# Patient Record
Sex: Female | Born: 1975 | Race: White | Hispanic: No | Marital: Married | Smoking: Never smoker
Health system: Southern US, Community
[De-identification: ages and names within clinical notes are randomized; demographics above are authoritative.]

## PROBLEM LIST (undated history)

## (undated) DIAGNOSIS — R87619 Unspecified abnormal cytological findings in specimens from cervix uteri: Secondary | ICD-10-CM

## (undated) DIAGNOSIS — K589 Irritable bowel syndrome without diarrhea: Secondary | ICD-10-CM

## (undated) DIAGNOSIS — Z8619 Personal history of other infectious and parasitic diseases: Secondary | ICD-10-CM

## (undated) DIAGNOSIS — IMO0002 Reserved for concepts with insufficient information to code with codable children: Secondary | ICD-10-CM

## (undated) DIAGNOSIS — O09529 Supervision of elderly multigravida, unspecified trimester: Secondary | ICD-10-CM

## (undated) HISTORY — DX: Unspecified abnormal cytological findings in specimens from cervix uteri: R87.619

## (undated) HISTORY — DX: Supervision of elderly multigravida, unspecified trimester: O09.529

## (undated) HISTORY — PX: WISDOM TOOTH EXTRACTION: SHX21

## (undated) HISTORY — DX: Reserved for concepts with insufficient information to code with codable children: IMO0002

## (undated) HISTORY — DX: Irritable bowel syndrome, unspecified: K58.9

## (undated) HISTORY — DX: Personal history of other infectious and parasitic diseases: Z86.19

---

## 2010-09-22 ENCOUNTER — Inpatient Hospital Stay (HOSPITAL_COMMUNITY): Admission: AD | Admit: 2010-09-22 | Payer: Self-pay | Source: Ambulatory Visit | Admitting: Obstetrics and Gynecology

## 2010-09-23 ENCOUNTER — Inpatient Hospital Stay (HOSPITAL_COMMUNITY): Admission: AD | Admit: 2010-09-23 | Payer: Self-pay | Source: Ambulatory Visit | Admitting: Obstetrics and Gynecology

## 2011-03-13 LAB — HIV ANTIBODY (ROUTINE TESTING W REFLEX): HIV: NONREACTIVE

## 2011-03-13 LAB — RUBELLA ANTIBODY, IGM: Rubella: IMMUNE

## 2011-03-13 LAB — HEPATITIS B SURFACE ANTIGEN: Hepatitis B Surface Ag: NEGATIVE

## 2011-03-13 LAB — ANTIBODY SCREEN: Antibody Screen: NEGATIVE

## 2011-03-13 LAB — RPR: RPR: NONREACTIVE

## 2011-10-11 ENCOUNTER — Inpatient Hospital Stay (HOSPITAL_COMMUNITY): Admission: AD | Admit: 2011-10-11 | Payer: Self-pay | Source: Home / Self Care | Admitting: Obstetrics and Gynecology

## 2011-10-12 ENCOUNTER — Telehealth (HOSPITAL_COMMUNITY): Payer: Self-pay | Admitting: *Deleted

## 2011-10-12 ENCOUNTER — Encounter (HOSPITAL_COMMUNITY): Payer: Self-pay | Admitting: *Deleted

## 2011-10-12 NOTE — Telephone Encounter (Signed)
Preadmission screen  

## 2011-10-20 ENCOUNTER — Encounter (HOSPITAL_COMMUNITY): Payer: Self-pay

## 2011-10-20 ENCOUNTER — Inpatient Hospital Stay (HOSPITAL_COMMUNITY)
Admission: RE | Admit: 2011-10-20 | Discharge: 2011-10-25 | DRG: 371 | Disposition: A | Discharge status: Home Health | Payer: BC Managed Care – PPO | Source: Ambulatory Visit | Attending: Obstetrics and Gynecology | Admitting: Obstetrics and Gynecology

## 2011-10-20 DIAGNOSIS — O480 Post-term pregnancy: Principal | ICD-10-CM | POA: Diagnosis present

## 2011-10-20 DIAGNOSIS — O09519 Supervision of elderly primigravida, unspecified trimester: Secondary | ICD-10-CM | POA: Diagnosis present

## 2011-10-20 DIAGNOSIS — O324XX0 Maternal care for high head at term, not applicable or unspecified: Secondary | ICD-10-CM | POA: Diagnosis present

## 2011-10-20 LAB — CBC
HCT: 38.3 % (ref 36.0–46.0)
Hemoglobin: 12.7 g/dL (ref 12.0–15.0)
RBC: 4.38 MIL/uL (ref 3.87–5.11)
WBC: 11.6 10*3/uL — ABNORMAL HIGH (ref 4.0–10.5)

## 2011-10-20 MED ORDER — LACTATED RINGERS IV SOLN
INTRAVENOUS | Status: DC
  Administered 2011-10-20 – 2011-10-21 (×3): via INTRAVENOUS
  Administered 2011-10-22: 300 mL via INTRAVENOUS

## 2011-10-20 MED ORDER — LACTATED RINGERS IV SOLN: 500.0000 mL | INTRAVENOUS | Status: DC | PRN

## 2011-10-20 MED ORDER — LIDOCAINE HCL (PF) 1 % IJ SOLN: 30.0000 mL | INTRAMUSCULAR | Status: DC | PRN

## 2011-10-20 MED ORDER — ACETAMINOPHEN 325 MG PO TABS: 650.0000 mg | ORAL_TABLET | ORAL | Status: DC | PRN

## 2011-10-20 MED ORDER — MISOPROSTOL 25 MCG QUARTER TABLET
25.0000 ug | ORAL_TABLET | ORAL | Status: DC | PRN
  Administered 2011-10-20 – 2011-10-21 (×2): 25 ug via VAGINAL
  Filled 2011-10-20 (×3): qty 0.25

## 2011-10-20 MED ORDER — OXYCODONE-ACETAMINOPHEN 5-325 MG PO TABS: 1.0000 | ORAL_TABLET | ORAL | Status: DC | PRN

## 2011-10-20 MED ORDER — FLEET ENEMA 7-19 GM/118ML RE ENEM: 1.0000 | ENEMA | RECTAL | Status: DC | PRN

## 2011-10-20 MED ORDER — ZOLPIDEM TARTRATE 10 MG PO TABS
10.0000 mg | ORAL_TABLET | Freq: Every evening | ORAL | Status: DC | PRN
  Administered 2011-10-20: 10 mg via ORAL
  Filled 2011-10-20: qty 1

## 2011-10-20 MED ORDER — OXYTOCIN BOLUS FROM INFUSION
500.0000 mL | Freq: Once | INTRAVENOUS | Status: DC
  Filled 2011-10-20: qty 500
  Filled 2011-10-20: qty 1000

## 2011-10-20 MED ORDER — IBUPROFEN 600 MG PO TABS: 600.0000 mg | ORAL_TABLET | Freq: Four times a day (QID) | ORAL | Status: DC | PRN

## 2011-10-20 MED ORDER — ONDANSETRON HCL 4 MG/2ML IJ SOLN
4.0000 mg | Freq: Four times a day (QID) | INTRAMUSCULAR | Status: DC | PRN
  Administered 2011-10-21: 4 mg via INTRAVENOUS
  Filled 2011-10-20: qty 2

## 2011-10-20 MED ORDER — CITRIC ACID-SODIUM CITRATE 334-500 MG/5ML PO SOLN
30.0000 mL | ORAL | Status: DC | PRN
  Administered 2011-10-22: 30 mL via ORAL
  Filled 2011-10-20: qty 15

## 2011-10-20 MED ORDER — TERBUTALINE SULFATE 1 MG/ML IJ SOLN: 0.2500 mg | Freq: Once | INTRAMUSCULAR | Status: AC | PRN

## 2011-10-20 MED ORDER — OXYTOCIN 20 UNITS IN LACTATED RINGERS INFUSION - SIMPLE: 125.0000 mL/h | Freq: Once | INTRAVENOUS | Status: DC

## 2011-10-20 NOTE — Progress Notes (Signed)
Called pharmacy to inform that [facility] is out of cytotec. Was told that someone from pharmacy was on their way to restock.

## 2011-10-21 ENCOUNTER — Encounter (HOSPITAL_COMMUNITY): Payer: Self-pay

## 2011-10-21 LAB — RPR: RPR Ser Ql: NONREACTIVE

## 2011-10-21 MED ORDER — OXYTOCIN 20 UNITS IN LACTATED RINGERS INFUSION - SIMPLE
1.0000 m[IU]/min | INTRAVENOUS | Status: DC
  Administered 2011-10-21: 2 m[IU]/min via INTRAVENOUS
  Administered 2011-10-21: 16 m[IU]/min via INTRAVENOUS

## 2011-10-21 MED ORDER — DIPHENHYDRAMINE HCL 50 MG/ML IJ SOLN: 12.5000 mg | INTRAMUSCULAR | Status: DC | PRN

## 2011-10-21 MED ORDER — EPHEDRINE 5 MG/ML INJ
10.0000 mg | INTRAVENOUS | Status: DC | PRN
  Administered 2011-10-21: 10 mg via INTRAVENOUS

## 2011-10-21 MED ORDER — FENTANYL 2.5 MCG/ML BUPIVACAINE 1/10 % EPIDURAL INFUSION (WH - ANES)
14.0000 mL/h | INTRAMUSCULAR | Status: DC
  Administered 2011-10-21 – 2011-10-22 (×4): 14 mL/h via EPIDURAL
  Filled 2011-10-21 (×4): qty 60

## 2011-10-21 MED ORDER — PHENYLEPHRINE 40 MCG/ML (10ML) SYRINGE FOR IV PUSH (FOR BLOOD PRESSURE SUPPORT): 80.0000 ug | PREFILLED_SYRINGE | INTRAVENOUS | Status: DC | PRN

## 2011-10-21 MED ORDER — TERBUTALINE SULFATE 1 MG/ML IJ SOLN: 0.2500 mg | Freq: Once | INTRAMUSCULAR | Status: AC | PRN

## 2011-10-21 MED ORDER — LIDOCAINE HCL (PF) 1 % IJ SOLN
INTRAMUSCULAR | Status: DC | PRN
  Administered 2011-10-21 (×3): 4 mL

## 2011-10-21 MED ORDER — LACTATED RINGERS IV SOLN: 500.0000 mL | Freq: Once | INTRAVENOUS | Status: DC

## 2011-10-21 MED ORDER — ZOLPIDEM TARTRATE 10 MG PO TABS: 10.0000 mg | ORAL_TABLET | Freq: Every evening | ORAL | Status: DC | PRN

## 2011-10-21 MED ORDER — EPHEDRINE 5 MG/ML INJ: 10.0000 mg | INTRAVENOUS | Status: DC | PRN

## 2011-10-21 NOTE — Anesthesia Preprocedure Evaluation (Signed)
Anesthesia Evaluation  Patient identified by MRN, date of birth, ID band Patient awake    Reviewed: Allergy & Precautions, H&P , NPO status , Patient's Chart, lab work & pertinent test results, reviewed documented beta blocker date and time   History of Anesthesia Complications Negative for: history of anesthetic complications  Airway Mallampati: I TM Distance: >3 FB Neck ROM: full    Dental  (+) Teeth Intact   Pulmonary neg pulmonary ROS,  clear to auscultation        Cardiovascular neg cardio ROS regular Normal    Neuro/Psych Negative Neurological ROS  Negative Psych ROS   GI/Hepatic negative GI ROS, Neg liver ROS,   Endo/Other  Negative Endocrine ROS  Renal/GU negative Renal ROS  Genitourinary negative   Musculoskeletal   Abdominal   Peds  Hematology negative hematology ROS (+)   Anesthesia Other Findings   Reproductive/Obstetrics (+) Pregnancy                           Anesthesia Physical Anesthesia Plan  ASA: II  Anesthesia Plan: Epidural   Post-op Pain Management:    Induction:   Airway Management Planned:   Additional Equipment:   Intra-op Plan:   Post-operative Plan:   Informed Consent: I have reviewed the patients History and Physical, chart, labs and discussed the procedure including the risks, benefits and alternatives for the proposed anesthesia with the patient or authorized representative who has indicated his/her understanding and acceptance.     Plan Discussed with:   Anesthesia Plan Comments:         Anesthesia Quick Evaluation  

## 2011-10-21 NOTE — H&P (Signed)
Erica Green is a 36 y.o. female presenting for induction of labor due to postdates.  Preg complicated by AMA with normal 1st tri screen GBS -. Maternal Medical History:  Contractions: Onset was 1-2 hours ago.      OB History    Grav Para Term Preterm Abortions TAB SAB Ect Mult Living   1 0 0 0 0 0 0 0 0 0      Past Medical History  Diagnosis Date  . Abnormal Pap smear     mild dysplasia  . History of chicken pox   . AMA (advanced maternal age) multigravida 35+   . IBS (irritable bowel syndrome)    Past Surgical History  Procedure Date  . Wisdom tooth extraction    Family History: family history includes Cancer in her maternal grandmother; Diabetes in her paternal grandfather; and Hypertension in her maternal aunt, maternal grandmother, and mother. Social History:  reports that she has never smoked. She has never used smokeless tobacco. She reports that she does not drink alcohol or use illicit drugs.  ROS  Dilation: 1 Effacement (%): 90 Station: -2 Exam by:: Rolena Knutson Blood pressure 110/68, pulse 80, temperature 98.5 F (36.9 C), temperature source Oral, resp. rate 20, height 5' 4.5" (1.638 m), weight 77.111 kg (170 lb), last menstrual period 01/06/2011. Exam Physical Exam  Prenatal labs: ABO, Rh: A/Positive/-- (07/20 0000) Antibody: Negative (07/20 0000) Rubella: Immune (07/20 0000) RPR: Nonreactive (07/20 0000)  HBsAg: Negative (07/20 0000)  HIV: Non-reactive (07/20 0000)  GBS: Negative (01/21 0000)   Assessment/Plan: IUP postdates AROM and pitocin  Anticipate SVD   Jenniah Bhavsar C 10/21/2011, 8:31 AM

## 2011-10-21 NOTE — Anesthesia Procedure Notes (Signed)
Epidural Patient location during procedure: OB Start time: 10/21/2011 2:05 PM Reason for block: procedure for pain  Staffing Performed by: anesthesiologist   Preanesthetic Checklist Completed: patient identified, site marked, surgical consent, pre-op evaluation, timeout performed, IV checked, risks and benefits discussed and monitors and equipment checked  Epidural Patient position: sitting Prep: site prepped and draped and DuraPrep Patient monitoring: continuous pulse ox and blood pressure Approach: midline Injection technique: LOR air  Needle:  Needle type: Tuohy  Needle gauge: 17 G Needle length: 9 cm Catheter type: closed end flexible Catheter size: 19 Gauge Test dose: negative  Assessment Events: blood not aspirated, injection not painful, no injection resistance, negative IV test and paresthesia  Additional Notes Discussed risk of headache, infection, bleeding, nerve injury and failed or incomplete block.  Patient voices understanding and wishes to proceed.  Left sided paresthesia with initial placement of catheter, unrelieved by redirecting the bevel.  Paresthesia resolved completely and immediately with removal of the catheter.  Removed and replaced at same level without incident.  Jasmine December, MD

## 2011-10-22 ENCOUNTER — Inpatient Hospital Stay (HOSPITAL_COMMUNITY): Payer: BC Managed Care – PPO | Admitting: Anesthesiology

## 2011-10-22 ENCOUNTER — Encounter (HOSPITAL_COMMUNITY): Payer: Self-pay | Admitting: Anesthesiology

## 2011-10-22 ENCOUNTER — Encounter (HOSPITAL_COMMUNITY): Payer: Self-pay

## 2011-10-22 ENCOUNTER — Encounter (HOSPITAL_COMMUNITY)
Admission: RE | Disposition: A | Discharge status: Home Health | Payer: Self-pay | Source: Ambulatory Visit | Attending: Obstetrics and Gynecology

## 2011-10-22 SURGERY — Surgical Case
Anesthesia: Regional | Site: Abdomen | Wound class: Clean Contaminated

## 2011-10-22 MED ORDER — MORPHINE SULFATE (PF) 0.5 MG/ML IJ SOLN
INTRAMUSCULAR | Status: DC | PRN
  Administered 2011-10-22: 4 mg via EPIDURAL

## 2011-10-22 MED ORDER — MORPHINE SULFATE (PF) 0.5 MG/ML IJ SOLN
INTRAMUSCULAR | Status: DC | PRN
  Administered 2011-10-22: 1 mg via INTRAVENOUS

## 2011-10-22 MED ORDER — PROMETHAZINE HCL 25 MG/ML IJ SOLN: 6.2500 mg | INTRAMUSCULAR | Status: DC | PRN

## 2011-10-22 MED ORDER — KETOROLAC TROMETHAMINE 30 MG/ML IJ SOLN: 30.0000 mg | Freq: Four times a day (QID) | INTRAMUSCULAR | Status: AC | PRN

## 2011-10-22 MED ORDER — LACTATED RINGERS IR SOLN
Status: DC | PRN
  Administered 2011-10-22: 1000 mL

## 2011-10-22 MED ORDER — DIBUCAINE 1 % RE OINT: 1.0000 "application " | TOPICAL_OINTMENT | RECTAL | Status: DC | PRN

## 2011-10-22 MED ORDER — DIPHENHYDRAMINE HCL 50 MG/ML IJ SOLN: 12.5000 mg | INTRAMUSCULAR | Status: DC | PRN

## 2011-10-22 MED ORDER — MENTHOL 3 MG MT LOZG: 1.0000 | LOZENGE | OROMUCOSAL | Status: DC | PRN

## 2011-10-22 MED ORDER — ONDANSETRON HCL 4 MG/2ML IJ SOLN: 4.0000 mg | INTRAMUSCULAR | Status: DC | PRN

## 2011-10-22 MED ORDER — LANOLIN HYDROUS EX OINT: 1.0000 "application " | TOPICAL_OINTMENT | CUTANEOUS | Status: DC | PRN

## 2011-10-22 MED ORDER — SIMETHICONE 80 MG PO CHEW
80.0000 mg | CHEWABLE_TABLET | Freq: Three times a day (TID) | ORAL | Status: DC
  Administered 2011-10-22 – 2011-10-25 (×9): 80 mg via ORAL

## 2011-10-22 MED ORDER — TETANUS-DIPHTH-ACELL PERTUSSIS 5-2.5-18.5 LF-MCG/0.5 IM SUSP: 0.5000 mL | Freq: Once | INTRAMUSCULAR | Status: DC

## 2011-10-22 MED ORDER — LIDOCAINE-EPINEPHRINE (PF) 2 %-1:200000 IJ SOLN
INTRAMUSCULAR | Status: AC
  Filled 2011-10-22: qty 20

## 2011-10-22 MED ORDER — KETOROLAC TROMETHAMINE 60 MG/2ML IM SOLN
INTRAMUSCULAR | Status: AC
  Filled 2011-10-22: qty 2

## 2011-10-22 MED ORDER — MEPERIDINE HCL 25 MG/ML IJ SOLN: 6.2500 mg | INTRAMUSCULAR | Status: DC | PRN

## 2011-10-22 MED ORDER — OXYTOCIN 20 UNITS IN LACTATED RINGERS INFUSION - SIMPLE
INTRAVENOUS | Status: DC | PRN
  Administered 2011-10-22: 20 [IU] via INTRAVENOUS

## 2011-10-22 MED ORDER — PRENATAL MULTIVITAMIN CH
1.0000 | ORAL_TABLET | Freq: Every day | ORAL | Status: DC
  Administered 2011-10-23 – 2011-10-25 (×3): 1 via ORAL
  Filled 2011-10-22 (×4): qty 1

## 2011-10-22 MED ORDER — OXYTOCIN 10 UNIT/ML IJ SOLN
INTRAMUSCULAR | Status: AC
  Filled 2011-10-22: qty 2

## 2011-10-22 MED ORDER — SCOPOLAMINE 1 MG/3DAYS TD PT72
1.0000 | MEDICATED_PATCH | Freq: Once | TRANSDERMAL | Status: AC
  Administered 2011-10-22: 1.5 mg via TRANSDERMAL

## 2011-10-22 MED ORDER — ONDANSETRON HCL 4 MG/2ML IJ SOLN
INTRAMUSCULAR | Status: DC | PRN
  Administered 2011-10-22: 4 mg via INTRAVENOUS

## 2011-10-22 MED ORDER — SIMETHICONE 80 MG PO CHEW: 80.0000 mg | CHEWABLE_TABLET | ORAL | Status: DC | PRN

## 2011-10-22 MED ORDER — KETOROLAC TROMETHAMINE 30 MG/ML IJ SOLN: 15.0000 mg | Freq: Once | INTRAMUSCULAR | Status: DC | PRN

## 2011-10-22 MED ORDER — OXYCODONE-ACETAMINOPHEN 5-325 MG PO TABS
1.0000 | ORAL_TABLET | ORAL | Status: DC | PRN
  Administered 2011-10-22 – 2011-10-25 (×8): 1 via ORAL
  Filled 2011-10-22 (×8): qty 1

## 2011-10-22 MED ORDER — NALBUPHINE HCL 10 MG/ML IJ SOLN
5.0000 mg | INTRAMUSCULAR | Status: DC | PRN
  Filled 2011-10-22: qty 1

## 2011-10-22 MED ORDER — WITCH HAZEL-GLYCERIN EX PADS: 1.0000 "application " | MEDICATED_PAD | CUTANEOUS | Status: DC | PRN

## 2011-10-22 MED ORDER — SCOPOLAMINE 1 MG/3DAYS TD PT72
MEDICATED_PATCH | TRANSDERMAL | Status: AC
  Filled 2011-10-22: qty 1

## 2011-10-22 MED ORDER — OXYTOCIN 20 UNITS IN LACTATED RINGERS INFUSION - SIMPLE
125.0000 mL/h | INTRAVENOUS | Status: AC
  Administered 2011-10-22: 125 mL/h via INTRAVENOUS
  Filled 2011-10-22: qty 1000

## 2011-10-22 MED ORDER — DIPHENHYDRAMINE HCL 25 MG PO CAPS: 25.0000 mg | ORAL_CAPSULE | Freq: Four times a day (QID) | ORAL | Status: DC | PRN

## 2011-10-22 MED ORDER — IBUPROFEN 600 MG PO TABS
600.0000 mg | ORAL_TABLET | Freq: Four times a day (QID) | ORAL | Status: DC
  Administered 2011-10-22 – 2011-10-25 (×12): 600 mg via ORAL
  Filled 2011-10-22 (×7): qty 1

## 2011-10-22 MED ORDER — ONDANSETRON HCL 4 MG/2ML IJ SOLN
INTRAMUSCULAR | Status: AC
  Filled 2011-10-22: qty 2

## 2011-10-22 MED ORDER — ONDANSETRON HCL 4 MG/2ML IJ SOLN: 4.0000 mg | Freq: Three times a day (TID) | INTRAMUSCULAR | Status: DC | PRN

## 2011-10-22 MED ORDER — KETOROLAC TROMETHAMINE 60 MG/2ML IM SOLN
60.0000 mg | Freq: Once | INTRAMUSCULAR | Status: AC | PRN
  Administered 2011-10-22: 60 mg via INTRAMUSCULAR

## 2011-10-22 MED ORDER — SODIUM BICARBONATE 8.4 % IV SOLN
INTRAVENOUS | Status: DC | PRN
  Administered 2011-10-22 (×2): 5 mL via EPIDURAL

## 2011-10-22 MED ORDER — LACTATED RINGERS IV SOLN
INTRAVENOUS | Status: DC | PRN
  Administered 2011-10-22: 02:00:00 via INTRAVENOUS

## 2011-10-22 MED ORDER — OXYTOCIN 20 UNITS IN LACTATED RINGERS INFUSION - SIMPLE
INTRAVENOUS | Status: AC
  Filled 2011-10-22: qty 1000

## 2011-10-22 MED ORDER — SODIUM CHLORIDE 0.9 % IV SOLN
1.0000 ug/kg/h | INTRAVENOUS | Status: DC | PRN
  Filled 2011-10-22: qty 2.5

## 2011-10-22 MED ORDER — MORPHINE SULFATE 0.5 MG/ML IJ SOLN
INTRAMUSCULAR | Status: AC
  Filled 2011-10-22: qty 10

## 2011-10-22 MED ORDER — MEPERIDINE HCL 25 MG/ML IJ SOLN
6.2500 mg | INTRAMUSCULAR | Status: DC | PRN
  Administered 2011-10-22: 6.25 mg via INTRAVENOUS

## 2011-10-22 MED ORDER — DEXTROSE 5 % IV SOLN
1.0000 g | Freq: Two times a day (BID) | INTRAVENOUS | Status: DC
  Filled 2011-10-22 (×2): qty 1

## 2011-10-22 MED ORDER — DIPHENHYDRAMINE HCL 25 MG PO CAPS: 25.0000 mg | ORAL_CAPSULE | ORAL | Status: DC | PRN

## 2011-10-22 MED ORDER — HYDROMORPHONE HCL PF 1 MG/ML IJ SOLN: 0.2500 mg | INTRAMUSCULAR | Status: DC | PRN

## 2011-10-22 MED ORDER — SENNOSIDES-DOCUSATE SODIUM 8.6-50 MG PO TABS
2.0000 | ORAL_TABLET | Freq: Every day | ORAL | Status: DC
  Administered 2011-10-22 – 2011-10-24 (×3): 2 via ORAL

## 2011-10-22 MED ORDER — IBUPROFEN 600 MG PO TABS
600.0000 mg | ORAL_TABLET | Freq: Four times a day (QID) | ORAL | Status: DC | PRN
  Filled 2011-10-22 (×6): qty 1

## 2011-10-22 MED ORDER — MEPERIDINE HCL 25 MG/ML IJ SOLN
INTRAMUSCULAR | Status: AC
  Filled 2011-10-22: qty 1

## 2011-10-22 MED ORDER — DIPHENHYDRAMINE HCL 50 MG/ML IJ SOLN: 25.0000 mg | INTRAMUSCULAR | Status: DC | PRN

## 2011-10-22 MED ORDER — ZOLPIDEM TARTRATE 5 MG PO TABS: 5.0000 mg | ORAL_TABLET | Freq: Every evening | ORAL | Status: DC | PRN

## 2011-10-22 MED ORDER — SODIUM BICARBONATE 8.4 % IV SOLN
INTRAVENOUS | Status: AC
  Filled 2011-10-22: qty 50

## 2011-10-22 MED ORDER — LACTATED RINGERS IV SOLN
INTRAVENOUS | Status: DC
  Administered 2011-10-22: 22:00:00 via INTRAVENOUS

## 2011-10-22 MED ORDER — ONDANSETRON HCL 4 MG PO TABS: 4.0000 mg | ORAL_TABLET | ORAL | Status: DC | PRN

## 2011-10-22 SURGICAL SUPPLY — 26 items
CLOTH BEACON ORANGE TIMEOUT ST (SAFETY) ×2 IMPLANT
DRESSING TELFA 8X3 (GAUZE/BANDAGES/DRESSINGS) IMPLANT
DRSG COVADERM 4X10 (GAUZE/BANDAGES/DRESSINGS) ×2 IMPLANT
ELECT REM PT RETURN 9FT ADLT (ELECTROSURGICAL) ×2
ELECTRODE REM PT RTRN 9FT ADLT (ELECTROSURGICAL) ×1 IMPLANT
EXTRACTOR VACUUM M CUP 4 TUBE (SUCTIONS) IMPLANT
GAUZE SPONGE 4X4 12PLY STRL LF (GAUZE/BANDAGES/DRESSINGS) IMPLANT
GLOVE BIO SURGEON STRL SZ8 (GLOVE) ×2 IMPLANT
GLOVE SURG ORTHO 8.0 STRL STRW (GLOVE) ×2 IMPLANT
GOWN PREVENTION PLUS LG XLONG (DISPOSABLE) ×4 IMPLANT
KIT ABG SYR 3ML LUER SLIP (SYRINGE) ×2 IMPLANT
NEEDLE HYPO 25X5/8 SAFETYGLIDE (NEEDLE) ×2 IMPLANT
NS IRRIG 1000ML POUR BTL (IV SOLUTION) ×2 IMPLANT
PACK C SECTION WH (CUSTOM PROCEDURE TRAY) ×2 IMPLANT
PAD ABD 7.5X8 STRL (GAUZE/BANDAGES/DRESSINGS) ×2 IMPLANT
SLEEVE SCD COMPRESS KNEE MED (MISCELLANEOUS) ×2 IMPLANT
STAPLER VISISTAT 35W (STAPLE) ×2 IMPLANT
SUT MNCRL 0 VIOLET CTX 36 (SUTURE) ×3 IMPLANT
SUT MONOCRYL 0 CTX 36 (SUTURE) ×3
SUT PDS AB 1 CT  36 (SUTURE)
SUT PDS AB 1 CT 36 (SUTURE) IMPLANT
SUT VIC AB 1 CTX 36 (SUTURE) ×1
SUT VIC AB 1 CTX36XBRD ANBCTRL (SUTURE) ×1 IMPLANT
TOWEL OR 17X24 6PK STRL BLUE (TOWEL DISPOSABLE) ×4 IMPLANT
TRAY FOLEY CATH 14FR (SET/KITS/TRAYS/PACK) IMPLANT
WATER STERILE IRR 1000ML POUR (IV SOLUTION) ×2 IMPLANT

## 2011-10-22 NOTE — Anesthesia Postprocedure Evaluation (Signed)
  Anesthesia Post-op Note  Patient: Erica Green  Procedure(s) Performed: Procedure(s) (LRB): CESAREAN SECTION (N/A)  Patient Location: Mother/Baby  Anesthesia Type: Epidural  Level of Consciousness: awake  Airway and Oxygen Therapy: Patient Spontanous Breathing  Post-op Pain: mild  Post-op Assessment: Patient's Cardiovascular Status Stable, Respiratory Function Stable, Patent Airway, No signs of Nausea or vomiting, Adequate PO intake, Pain level controlled, No headache, No backache, No residual numbness and No residual motor weakness  Post-op Vital Signs: Reviewed and stable  Complications: No apparent anesthesia complications

## 2011-10-22 NOTE — Transfer of Care (Signed)
Immediate Anesthesia Transfer of Care Note  Patient: Erica Green  Procedure(s) Performed: Procedure(s) (LRB): CESAREAN SECTION (N/A)  Patient Location: PACU  Anesthesia Type: Epidural  Level of Consciousness: awake, alert , oriented and patient cooperative  Airway & Oxygen Therapy: Patient Spontanous Breathing  Post-op Assessment: Report given to PACU RN and Post -op Vital signs reviewed and stable  Post vital signs: Reviewed and stable  Complications: No apparent anesthesia complications

## 2011-10-22 NOTE — Consult Note (Signed)
Requested to attend primary C/S at 41+ weeks secondary to arrested descent. No other risk factors reported except for one notable allergy that will not be a factor for the attending team.    At delivery infant manually extracted with spontaneous lusty cries and active movement of all extremities. Given vigorous tactile stimulation and bulb suction of naso/oropharynx yielding minimal mucus.  No dysmorphic features.    Shown to parents and care changed to assigned pediatrician and L/D RN present in OR.    Dagoberto Ligas MD [facility]

## 2011-10-22 NOTE — OR Nursing (Signed)
Uterus massaged by S. Ashira Kirsten Charity fundraiser. Two tubes of cord blood to lab. Foley catheter in upon arrival to OR. Urine color-concentrated.

## 2011-10-22 NOTE — Op Note (Signed)
Cesarean Section Procedure Note  Pre-operative Diagnosis: IUP at 41 weeks, Arrest of descent Post-operative Diagnosis: same  Surgeon: Turner Daniels   Assistants: none  Anesthesia:epidural  Procedure:  Low Segment Transverse cesarean section  Procedure Details  The patient was seen in the Holding Room. The risks, benefits, complications, treatment options, and expected outcomes were discussed with the patient.  The patient concurred with the proposed plan, giving informed consent.  The site of surgery properly noted/marked.. A Time Out was held and the above information confirmed.  After induction of anesthesia, the patient was draped and prepped in the usual sterile manner. A Pfannenstiel incision was made and carried down through the subcutaneous tissue to the fascia. Fascial incision was made and extended transversely. The fascia was separated from the underlying rectus tissue superiorly and inferiorly. The peritoneum was identified and entered. Peritoneal incision was extended longitudinally. The utero-vesical peritoneal reflection was incised transversely and the bladder flap was bluntly freed from the lower uterine segment. A low transverse uterine incision was made. Delivered from vertex presentation was a baby with Apgar scores of 9 at one minute and 9 at five minutes. After the umbilical cord was clamped and cut cord blood was obtained for evaluation. The placenta was removed intact and appeared normal. The uterine outline, tubes and ovaries appeared normal. The uterine incision was closed with running locked sutures of 0 monocryl and imbricated with 0 monocryl. Hemostasis was observed. Lavage was carried out until clear. The peritoneum was then closed with 0 monocryl and rectus muscles plicated in the midline.  After hemostasis was assured, the fascia was then reapproximated with running sutures of 0 Vicryl. Irrigation was applied and after adequate hemostasis was assured, the skin was  reapproximated with staples.  Instrument, sponge, and needle counts were correct prior the abdominal closure and at the conclusion of the case. The patient received 1 gram cefotetan preoperatively.  Findings: Viable female, ph art sent  Estimated Blood Loss:  600         Specimens: Placenta was sent to L&D         Complications:  None

## 2011-10-22 NOTE — Progress Notes (Signed)
Patient ID: Erica Green, female   DOB: [birth year], 36 y.o.   MRN: [id number] Pt pushing for 2 hours with no descent below -1 station.  Narrow pelvic outlet. FHR reassuring and labor pattern adequate.  No further descent despite excellent maternal expulsive effort. Risks and benefits of C/S were discussed.  All questions were answered and informed consent was obtained.  Plan to proceed with low segment transverse Cesarean Section. Risks and benefits of C/S were discussed.

## 2011-10-22 NOTE — Progress Notes (Signed)
Subjective: Postpartum Day 0: Cesarean Delivery Patient reports tolerating PO.    Objective: Vital signs in last 24 hours: Temp:  [97.4 F (36.3 C)-98.9 F (37.2 C)] 97.9 F (36.6 C) (02/28 0701) Pulse Rate:  [59-117] 75  (02/28 0701) Resp:  [14-24] 18  (02/28 0701) BP: (79-140)/(38-98) 111/64 mmHg (02/28 0701) SpO2:  [94 %-100 %] 94 % (02/28 0701)  Physical Exam:  General: alert and cooperative Lochia: appropriate Uterine Fundus: firm Abd dressing noted with scant drainage noted on bandage +BS Foley with adequate output DVT Evaluation: No evidence of DVT seen on physical exam.   Basename 10/20/11 1935  HGB 12.7  HCT 38.3    Assessment/Plan: Status post Cesarean section. Doing well postoperatively.  Continue current care.  Kennard Fildes G 10/22/2011, 8:33 AM

## 2011-10-23 ENCOUNTER — Encounter (HOSPITAL_COMMUNITY): Payer: Self-pay | Admitting: Obstetrics and Gynecology

## 2011-10-23 LAB — CBC
Platelets: 155 10*3/uL (ref 150–400)
RDW: 13.2 % (ref 11.5–15.5)
WBC: 13.7 10*3/uL — ABNORMAL HIGH (ref 4.0–10.5)

## 2011-10-23 MED ORDER — FERROUS SULFATE 325 (65 FE) MG PO TABS
325.0000 mg | ORAL_TABLET | Freq: Three times a day (TID) | ORAL | Status: DC
  Administered 2011-10-23 – 2011-10-25 (×4): 325 mg via ORAL
  Filled 2011-10-23 (×5): qty 1

## 2011-10-23 NOTE — Progress Notes (Signed)
Subjective: Postpartum Day 1: Cesarean Delivery Patient reports tolerating PO and no problems voiding.    Objective: Vital signs in last 24 hours: Temp:  [97.6 F (36.4 C)-98.3 F (36.8 C)] 97.6 F (36.4 C) (03/01 0558) Pulse Rate:  [57-75] 57  (03/01 0558) Resp:  [18-20] 18  (03/01 0558) BP: (93-111)/(54-73) 97/56 mmHg (03/01 0558) SpO2:  [95 %-100 %] 96 % (02/28 2200)  Physical Exam:  General: alert and cooperative Lochia: appropriate Uterine Fundus: firm Abd dressing intact with scant old drainage noted on bandage DVT Evaluation: No evidence of DVT seen on physical exam.   Basename 10/23/11 0550 10/20/11 1935  HGB 8.5* 12.7  HCT 25.8* 38.3    Assessment/Plan: Status post Cesarean section. Doing well postoperatively.  Continue current care CBC in am  Feso4 TID.  Evian Salguero G 10/23/2011, 8:39 AM

## 2011-10-24 LAB — CBC
Hemoglobin: 8.8 g/dL — ABNORMAL LOW (ref 12.0–15.0)
RBC: 3.09 MIL/uL — ABNORMAL LOW (ref 3.87–5.11)

## 2011-10-24 NOTE — Progress Notes (Signed)
POD #2  Tired, not much sleep last night Voiding, ambulating, passing flatus  Blood pressure 115/74, pulse 61, temperature 97.7 F (36.5 C), temperature source Oral, resp. rate 18, height 5' 4.5" (1.638 m), weight 77.111 kg (170 lb), last menstrual period 01/06/2011, SpO2 96.00%, unknown if currently breastfeeding.  Abdomen soft, BS good  Results for orders placed during the hospital encounter of 10/20/11 (from the past 24 hour(s))  CBC     Status: Abnormal   Collection Time   10/24/11  5:00 AM      Component Value Range   WBC 11.4 (*) 4.0 - 10.5 (K/uL)   RBC 3.09 (*) 3.87 - 5.11 (MIL/uL)   Hemoglobin 8.8 (*) 12.0 - 15.0 (g/dL)   HCT 40.9 (*) 81.1 - 46.0 (%)   MCV 88.7  78.0 - 100.0 (fL)   MCH 28.5  26.0 - 34.0 (pg)   MCHC 32.1  30.0 - 36.0 (g/dL)   RDW 91.4  78.2 - 95.6 (%)   Platelets 227  150 - 400 (K/uL)   Labs stable  A: satisfactory  P: continue present orders      Anticipate discharge tomorrow

## 2011-10-25 MED ORDER — OXYCODONE-ACETAMINOPHEN 5-325 MG PO TABS: 1.0000 | ORAL_TABLET | ORAL | Status: AC | PRN

## 2011-10-25 MED ORDER — IBUPROFEN 600 MG PO TABS: 600.0000 mg | ORAL_TABLET | Freq: Four times a day (QID) | ORAL | Status: AC | PRN

## 2011-10-25 MED ORDER — FERROUS SULFATE 325 (65 FE) MG PO TABS: 325.0000 mg | ORAL_TABLET | Freq: Two times a day (BID) | ORAL | Status: DC

## 2011-10-25 NOTE — Progress Notes (Signed)
POD #3 Ready for discharge  Blood pressure 122/77, pulse 64, temperature 98.2 F (36.8 C), temperature source Oral, resp. rate 18, height 5' 4.5" (1.638 m), weight 77.111 kg (170 lb), last menstrual period 01/06/2011, SpO2 96.00%, unknown if currently breastfeeding.  Incision healing well  A: Satisfactory   P: D/C home     Percocet 5/325  #30     Iron     Instructions given

## 2011-10-25 NOTE — Discharge Summary (Signed)
Obstetric Discharge Summary Reason for Admission: induction of labor Prenatal Procedures: ultrasound Intrapartum Procedures: cesarean: low cervical, transverse Postpartum Procedures: none Complications-Operative and Postpartum: none Hemoglobin  Date Value Range Status  10/24/2011 8.8* 12.0-15.0 (g/dL) Final     HCT  Date Value Range Status  10/24/2011 27.4* 36.0-46.0 (%) Final    Discharge Diagnoses: Term Pregnancy-delivered  Discharge Information: Date: 10/25/2011 Activity: pelvic rest Diet: routine Medications: PNV, Ibuprofen, Iron and Percocet Condition: stable Instructions: refer to practice specific booklet Discharge to: home Follow-up Information    Call in 2 weeks to follow up.         Newborn Data: Live born female  Birth Weight: 7 lb 12 oz (3515 g) APGAR: 9, 9  Home with mother.  Florabelle Cardin II,Janki Dike E 10/25/2011, 5:53 AM

## 2011-10-27 ENCOUNTER — Ambulatory Visit (HOSPITAL_COMMUNITY)
Admission: RE | Admit: 2011-10-27 | Discharge: 2011-10-27 | Disposition: A | Discharge status: Home / Self Care | Payer: BC Managed Care – PPO | Source: Ambulatory Visit | Attending: Obstetrics and Gynecology | Admitting: Obstetrics and Gynecology

## 2014-01-02 LAB — OB RESULTS CONSOLE GC/CHLAMYDIA
CHLAMYDIA, DNA PROBE: NEGATIVE
GC PROBE AMP, GENITAL: NEGATIVE

## 2014-01-02 LAB — OB RESULTS CONSOLE ANTIBODY SCREEN: Antibody Screen: NEGATIVE

## 2014-01-02 LAB — OB RESULTS CONSOLE RUBELLA ANTIBODY, IGM: RUBELLA: IMMUNE

## 2014-01-02 LAB — OB RESULTS CONSOLE ABO/RH: RH Type: POSITIVE

## 2014-01-02 LAB — OB RESULTS CONSOLE RPR: RPR: NONREACTIVE

## 2014-01-02 LAB — OB RESULTS CONSOLE HIV ANTIBODY (ROUTINE TESTING): HIV: NONREACTIVE

## 2014-01-02 LAB — OB RESULTS CONSOLE HEPATITIS B SURFACE ANTIGEN: HEP B S AG: NEGATIVE

## 2014-06-25 ENCOUNTER — Encounter (HOSPITAL_COMMUNITY): Payer: Self-pay | Admitting: Obstetrics and Gynecology

## 2014-07-24 ENCOUNTER — Encounter (HOSPITAL_COMMUNITY): Payer: Self-pay

## 2014-07-24 ENCOUNTER — Encounter (HOSPITAL_COMMUNITY)
Admission: RE | Admit: 2014-07-24 | Discharge: 2014-07-24 | Disposition: A | Discharge status: Home / Self Care | Payer: BC Managed Care – PPO | Source: Ambulatory Visit | Attending: Obstetrics and Gynecology | Admitting: Obstetrics and Gynecology

## 2014-07-24 DIAGNOSIS — Z01812 Encounter for preprocedural laboratory examination: Secondary | ICD-10-CM | POA: Insufficient documentation

## 2014-07-24 LAB — CBC
HCT: 33.7 % — ABNORMAL LOW (ref 36.0–46.0)
HEMOGLOBIN: 11.2 g/dL — AB (ref 12.0–15.0)
MCH: 29.2 pg (ref 26.0–34.0)
MCHC: 33.2 g/dL (ref 30.0–36.0)
MCV: 88 fL (ref 78.0–100.0)
Platelets: 210 10*3/uL (ref 150–400)
RBC: 3.83 MIL/uL — ABNORMAL LOW (ref 3.87–5.11)
RDW: 13.1 % (ref 11.5–15.5)
WBC: 8.7 10*3/uL (ref 4.0–10.5)

## 2014-07-24 LAB — TYPE AND SCREEN
ABO/RH(D): A POS
Antibody Screen: NEGATIVE

## 2014-07-24 LAB — ABO/RH: ABO/RH(D): A POS

## 2014-07-24 LAB — RPR

## 2014-07-24 NOTE — Patient Instructions (Addendum)
Your procedure is scheduled on:08/02/14  Enter through the Main Entrance at :1130 am Pick up desk phone and dial [phone] and inform us of your arrival.  Please call [phone] if you have any problems the morning of surgery.  Remember: Do not eat food after midnight:WED Clear liquids are ok until:9am on Thursday   You may brush your teeth the morning of surgery.   DO NOT wear jewelry, eye make-up, lipstick,body lotion, or dark fingernail polish.  (Polished toes are ok) You may wear deodorant.  If you are to be admitted after surgery, leave suitcase in car until your room has been assigned. Patients discharged on the day of surgery will not be allowed to drive home. Wear loose fitting, comfortable clothes for your ride home.

## 2014-07-29 NOTE — H&P (Addendum)
Erica PomfretJanine Green is a 38 y.o. female presenting for repeat c/s.  pregnancy complicated by ama with normal panorama (NIPT) previous c/s requests repeat. History OB History    Gravida Para Term Preterm AB TAB SAB Ectopic Multiple Living   2 1 1  0 0 0 0 0 0 1     Past Medical History  Diagnosis Date  . Abnormal Pap smear     mild dysplasia  . History of chicken pox   . AMA (advanced maternal age) multigravida 35+   . IBS (irritable bowel syndrome)    Past Surgical History  Procedure Laterality Date  . Wisdom tooth extraction    . Cesarean section  10/22/2011    Procedure: CESAREAN SECTION;  Surgeon: Erica Danielsavid C Avynn Klassen, MD;  Location: [facility];  Service: Gynecology;  Laterality: N/A;  Primary cesarean section with delivery of baby boy at 0213. Apgars 9/9.   Family History: family history includes Cancer in her maternal grandmother; Diabetes in her paternal grandfather; Hypertension in her maternal aunt, maternal grandmother, and mother. Social History:  reports that she has never smoked. She has never used smokeless tobacco. She reports that she does not drink alcohol or use illicit drugs.   Prenatal Transfer Tool  Maternal Diabetes: No Genetic Screening: Normal Maternal Ultrasounds/Referrals: Normal Fetal Ultrasounds or other Referrals:  None Maternal Substance Abuse:  No Significant Maternal Medications:  None Significant Maternal Lab Results:  None Other Comments:  None  ROS    Last menstrual period 11/02/2013, unknown if currently breastfeeding. Exam Physical Exam   Cx  Closed,thick, high Prenatal labs: ABO, Rh: --/--/A POS, A POS (12/01 1210) Antibody: NEG (12/01 1210) Rubella: Immune (05/12 0000) RPR: NON REAC (12/01 1210)  HBsAg: Negative (05/12 0000)  HIV: Non-reactive (05/12 0000)  GBS:     Assessment/Plan: IUP at 39 weeks Prev C/S for repeat Risks and benefits of C/S were discussed.  All questions were answered and informed consent was obtained.  Plan to proceed with  low segment transverse Cesarean Section.    Erica Green C 07/29/2014, 9:37 PM   This patient has been seen and examined.   All of her questions were answered.  Labs and vital signs reviewed.  Informed consent has been obtained.  The History and Physical is current. 08/02/14 1300 DL

## 2014-08-01 MED ORDER — DEXTROSE 5 % IV SOLN
2.0000 g | INTRAVENOUS | Status: AC
  Administered 2014-08-02: 2 g via INTRAVENOUS
  Filled 2014-08-01: qty 2

## 2014-08-02 ENCOUNTER — Inpatient Hospital Stay (HOSPITAL_COMMUNITY): Payer: BC Managed Care – PPO | Admitting: Anesthesiology

## 2014-08-02 ENCOUNTER — Encounter (HOSPITAL_COMMUNITY)
Admission: RE | Disposition: A | Discharge status: Home / Self Care | Payer: Self-pay | Source: Ambulatory Visit | Attending: Obstetrics and Gynecology

## 2014-08-02 ENCOUNTER — Inpatient Hospital Stay (HOSPITAL_COMMUNITY)
Admission: RE | Admit: 2014-08-02 | Discharge: 2014-08-05 | DRG: 766 | Disposition: A | Discharge status: Home / Self Care | Payer: BC Managed Care – PPO | Source: Ambulatory Visit | Attending: Obstetrics and Gynecology | Admitting: Obstetrics and Gynecology

## 2014-08-02 ENCOUNTER — Encounter (HOSPITAL_COMMUNITY): Payer: Self-pay | Admitting: Anesthesiology

## 2014-08-02 DIAGNOSIS — O09523 Supervision of elderly multigravida, third trimester: Secondary | ICD-10-CM | POA: Diagnosis not present

## 2014-08-02 DIAGNOSIS — Z833 Family history of diabetes mellitus: Secondary | ICD-10-CM | POA: Diagnosis not present

## 2014-08-02 DIAGNOSIS — K589 Irritable bowel syndrome without diarrhea: Secondary | ICD-10-CM | POA: Diagnosis present

## 2014-08-02 DIAGNOSIS — Z8249 Family history of ischemic heart disease and other diseases of the circulatory system: Secondary | ICD-10-CM

## 2014-08-02 DIAGNOSIS — O3421 Maternal care for scar from previous cesarean delivery: Secondary | ICD-10-CM | POA: Diagnosis present

## 2014-08-02 DIAGNOSIS — O99613 Diseases of the digestive system complicating pregnancy, third trimester: Secondary | ICD-10-CM | POA: Diagnosis present

## 2014-08-02 DIAGNOSIS — Z3A39 39 weeks gestation of pregnancy: Secondary | ICD-10-CM | POA: Diagnosis present

## 2014-08-02 LAB — TYPE AND SCREEN
ABO/RH(D): A POS
Antibody Screen: NEGATIVE

## 2014-08-02 LAB — CBC
HEMATOCRIT: 29.3 % — AB (ref 36.0–46.0)
HEMOGLOBIN: 9.9 g/dL — AB (ref 12.0–15.0)
MCH: 29.7 pg (ref 26.0–34.0)
MCHC: 33.8 g/dL (ref 30.0–36.0)
MCV: 88 fL (ref 78.0–100.0)
Platelets: 168 10*3/uL (ref 150–400)
RBC: 3.33 MIL/uL — ABNORMAL LOW (ref 3.87–5.11)
RDW: 13.1 % (ref 11.5–15.5)
WBC: 9.1 10*3/uL (ref 4.0–10.5)

## 2014-08-02 SURGERY — Surgical Case
Anesthesia: Spinal | Site: Abdomen

## 2014-08-02 MED ORDER — LACTATED RINGERS IV SOLN
Freq: Once | INTRAVENOUS | Status: AC
  Administered 2014-08-02: 12:00:00 via INTRAVENOUS

## 2014-08-02 MED ORDER — IBUPROFEN 600 MG PO TABS
600.0000 mg | ORAL_TABLET | Freq: Four times a day (QID) | ORAL | Status: DC
  Administered 2014-08-03 – 2014-08-05 (×11): 600 mg via ORAL
  Filled 2014-08-02 (×11): qty 1

## 2014-08-02 MED ORDER — OXYTOCIN 10 UNIT/ML IJ SOLN
INTRAMUSCULAR | Status: AC
  Filled 2014-08-02: qty 4

## 2014-08-02 MED ORDER — ONDANSETRON HCL 4 MG PO TABS: 4.0000 mg | ORAL_TABLET | ORAL | Status: DC | PRN

## 2014-08-02 MED ORDER — LANOLIN HYDROUS EX OINT: 1.0000 "application " | TOPICAL_OINTMENT | CUTANEOUS | Status: DC | PRN

## 2014-08-02 MED ORDER — ONDANSETRON HCL 4 MG/2ML IJ SOLN
INTRAMUSCULAR | Status: AC
  Filled 2014-08-02: qty 2

## 2014-08-02 MED ORDER — ZOLPIDEM TARTRATE 5 MG PO TABS: 5.0000 mg | ORAL_TABLET | Freq: Every evening | ORAL | Status: DC | PRN

## 2014-08-02 MED ORDER — DIBUCAINE 1 % RE OINT: 1.0000 "application " | TOPICAL_OINTMENT | RECTAL | Status: DC | PRN

## 2014-08-02 MED ORDER — DIPHENHYDRAMINE HCL 25 MG PO CAPS: 25.0000 mg | ORAL_CAPSULE | ORAL | Status: DC | PRN

## 2014-08-02 MED ORDER — KETOROLAC TROMETHAMINE 30 MG/ML IJ SOLN: 30.0000 mg | Freq: Four times a day (QID) | INTRAMUSCULAR | Status: AC | PRN

## 2014-08-02 MED ORDER — SIMETHICONE 80 MG PO CHEW
80.0000 mg | CHEWABLE_TABLET | ORAL | Status: DC
  Administered 2014-08-03 – 2014-08-04 (×3): 80 mg via ORAL
  Filled 2014-08-02 (×3): qty 1

## 2014-08-02 MED ORDER — NALBUPHINE HCL 10 MG/ML IJ SOLN
5.0000 mg | INTRAMUSCULAR | Status: DC | PRN
  Administered 2014-08-02: 5 mg via INTRAVENOUS
  Filled 2014-08-02: qty 1

## 2014-08-02 MED ORDER — CEFAZOLIN SODIUM-DEXTROSE 2-3 GM-% IV SOLR
INTRAVENOUS | Status: AC
  Filled 2014-08-02: qty 50

## 2014-08-02 MED ORDER — PHENYLEPHRINE 40 MCG/ML (10ML) SYRINGE FOR IV PUSH (FOR BLOOD PRESSURE SUPPORT)
PREFILLED_SYRINGE | INTRAVENOUS | Status: AC
  Filled 2014-08-02: qty 5

## 2014-08-02 MED ORDER — MEPERIDINE HCL 25 MG/ML IJ SOLN: 6.2500 mg | INTRAMUSCULAR | Status: DC | PRN

## 2014-08-02 MED ORDER — PROMETHAZINE HCL 25 MG/ML IJ SOLN: 6.2500 mg | INTRAMUSCULAR | Status: DC | PRN

## 2014-08-02 MED ORDER — SIMETHICONE 80 MG PO CHEW: 80.0000 mg | CHEWABLE_TABLET | ORAL | Status: DC | PRN

## 2014-08-02 MED ORDER — LACTATED RINGERS IV SOLN
INTRAVENOUS | Status: DC
  Administered 2014-08-02 (×3): via INTRAVENOUS

## 2014-08-02 MED ORDER — NALOXONE HCL 1 MG/ML IJ SOLN: 1.0000 ug/kg/h | INTRAVENOUS | Status: DC | PRN

## 2014-08-02 MED ORDER — SODIUM CHLORIDE 0.9 % IJ SOLN: 3.0000 mL | INTRAMUSCULAR | Status: DC | PRN

## 2014-08-02 MED ORDER — SIMETHICONE 80 MG PO CHEW
80.0000 mg | CHEWABLE_TABLET | Freq: Three times a day (TID) | ORAL | Status: DC
  Administered 2014-08-03 – 2014-08-05 (×6): 80 mg via ORAL
  Filled 2014-08-02 (×5): qty 1

## 2014-08-02 MED ORDER — NALOXONE HCL 0.4 MG/ML IJ SOLN: 0.4000 mg | INTRAMUSCULAR | Status: DC | PRN

## 2014-08-02 MED ORDER — FENTANYL CITRATE 0.05 MG/ML IJ SOLN: 25.0000 ug | INTRAMUSCULAR | Status: DC | PRN

## 2014-08-02 MED ORDER — NALBUPHINE HCL 10 MG/ML IJ SOLN: 5.0000 mg | Freq: Once | INTRAMUSCULAR | Status: AC | PRN

## 2014-08-02 MED ORDER — FENTANYL CITRATE 0.05 MG/ML IJ SOLN
INTRAMUSCULAR | Status: AC
  Filled 2014-08-02: qty 2

## 2014-08-02 MED ORDER — SCOPOLAMINE 1 MG/3DAYS TD PT72
MEDICATED_PATCH | TRANSDERMAL | Status: AC
  Administered 2014-08-02: 1.5 mg via TRANSDERMAL
  Filled 2014-08-02: qty 1

## 2014-08-02 MED ORDER — PRENATAL MULTIVITAMIN CH
1.0000 | ORAL_TABLET | Freq: Every day | ORAL | Status: DC
  Administered 2014-08-03 – 2014-08-05 (×3): 1 via ORAL
  Filled 2014-08-02 (×3): qty 1

## 2014-08-02 MED ORDER — DIPHENHYDRAMINE HCL 50 MG/ML IJ SOLN: 12.5000 mg | INTRAMUSCULAR | Status: DC | PRN

## 2014-08-02 MED ORDER — OXYCODONE-ACETAMINOPHEN 5-325 MG PO TABS
1.0000 | ORAL_TABLET | ORAL | Status: DC | PRN
  Administered 2014-08-03 – 2014-08-05 (×5): 1 via ORAL
  Filled 2014-08-02 (×5): qty 1

## 2014-08-02 MED ORDER — SCOPOLAMINE 1 MG/3DAYS TD PT72: 1.0000 | MEDICATED_PATCH | Freq: Once | TRANSDERMAL | Status: DC

## 2014-08-02 MED ORDER — MORPHINE SULFATE 0.5 MG/ML IJ SOLN
INTRAMUSCULAR | Status: AC
  Filled 2014-08-02: qty 10

## 2014-08-02 MED ORDER — PHENYLEPHRINE 8 MG IN D5W 100 ML (0.08MG/ML) PREMIX OPTIME
INJECTION | INTRAVENOUS | Status: DC | PRN
  Administered 2014-08-02: 60 ug/min via INTRAVENOUS

## 2014-08-02 MED ORDER — OXYCODONE-ACETAMINOPHEN 5-325 MG PO TABS
2.0000 | ORAL_TABLET | ORAL | Status: DC | PRN
  Administered 2014-08-05: 2 via ORAL
  Filled 2014-08-02: qty 2

## 2014-08-02 MED ORDER — KETOROLAC TROMETHAMINE 30 MG/ML IJ SOLN
30.0000 mg | Freq: Four times a day (QID) | INTRAMUSCULAR | Status: AC | PRN
  Administered 2014-08-02: 30 mg via INTRAMUSCULAR

## 2014-08-02 MED ORDER — OXYTOCIN 10 UNIT/ML IJ SOLN
40.0000 [IU] | INTRAVENOUS | Status: DC | PRN
  Administered 2014-08-02: 40 [IU] via INTRAVENOUS

## 2014-08-02 MED ORDER — KETOROLAC TROMETHAMINE 30 MG/ML IJ SOLN: 15.0000 mg | Freq: Once | INTRAMUSCULAR | Status: DC | PRN

## 2014-08-02 MED ORDER — NALBUPHINE HCL 10 MG/ML IJ SOLN
5.0000 mg | Freq: Once | INTRAMUSCULAR | Status: AC | PRN
Start: 2014-08-02 — End: 2014-08-02

## 2014-08-02 MED ORDER — DIPHENHYDRAMINE HCL 25 MG PO CAPS: 25.0000 mg | ORAL_CAPSULE | Freq: Four times a day (QID) | ORAL | Status: DC | PRN

## 2014-08-02 MED ORDER — NALBUPHINE HCL 10 MG/ML IJ SOLN: 5.0000 mg | INTRAMUSCULAR | Status: DC | PRN

## 2014-08-02 MED ORDER — MORPHINE SULFATE (PF) 0.5 MG/ML IJ SOLN
INTRAMUSCULAR | Status: DC | PRN
  Administered 2014-08-02: .1 mg via INTRATHECAL

## 2014-08-02 MED ORDER — LACTATED RINGERS IV SOLN
INTRAVENOUS | Status: DC | PRN
  Administered 2014-08-02: 14:00:00 via INTRAVENOUS

## 2014-08-02 MED ORDER — PHENYLEPHRINE 8 MG IN D5W 100 ML (0.08MG/ML) PREMIX OPTIME
INJECTION | INTRAVENOUS | Status: AC
  Filled 2014-08-02: qty 100

## 2014-08-02 MED ORDER — SCOPOLAMINE 1 MG/3DAYS TD PT72
1.0000 | MEDICATED_PATCH | Freq: Once | TRANSDERMAL | Status: DC
  Administered 2014-08-02: 1.5 mg via TRANSDERMAL

## 2014-08-02 MED ORDER — MENTHOL 3 MG MT LOZG
1.0000 | LOZENGE | OROMUCOSAL | Status: DC | PRN
  Filled 2014-08-02: qty 9

## 2014-08-02 MED ORDER — FENTANYL CITRATE 0.05 MG/ML IJ SOLN
INTRAMUSCULAR | Status: DC | PRN
  Administered 2014-08-02: 25 ug via INTRATHECAL

## 2014-08-02 MED ORDER — FERROUS SULFATE 325 (65 FE) MG PO TABS
325.0000 mg | ORAL_TABLET | Freq: Two times a day (BID) | ORAL | Status: DC
  Administered 2014-08-03 – 2014-08-05 (×4): 325 mg via ORAL
  Filled 2014-08-02 (×4): qty 1

## 2014-08-02 MED ORDER — SENNOSIDES-DOCUSATE SODIUM 8.6-50 MG PO TABS
2.0000 | ORAL_TABLET | ORAL | Status: DC
  Administered 2014-08-03 – 2014-08-04 (×3): 2 via ORAL
  Filled 2014-08-02 (×3): qty 2

## 2014-08-02 MED ORDER — PHENYLEPHRINE HCL 10 MG/ML IJ SOLN
INTRAMUSCULAR | Status: DC | PRN
  Administered 2014-08-02: 40 ug via INTRAVENOUS

## 2014-08-02 MED ORDER — WITCH HAZEL-GLYCERIN EX PADS: 1.0000 "application " | MEDICATED_PAD | CUTANEOUS | Status: DC | PRN

## 2014-08-02 MED ORDER — LACTATED RINGERS IV SOLN
INTRAVENOUS | Status: DC
  Administered 2014-08-03: 03:00:00 via INTRAVENOUS

## 2014-08-02 MED ORDER — KETOROLAC TROMETHAMINE 30 MG/ML IJ SOLN
INTRAMUSCULAR | Status: AC
  Filled 2014-08-02: qty 1

## 2014-08-02 MED ORDER — ONDANSETRON HCL 4 MG/2ML IJ SOLN: 4.0000 mg | Freq: Three times a day (TID) | INTRAMUSCULAR | Status: DC | PRN

## 2014-08-02 MED ORDER — ONDANSETRON HCL 4 MG/2ML IJ SOLN: 4.0000 mg | INTRAMUSCULAR | Status: DC | PRN

## 2014-08-02 MED ORDER — TETANUS-DIPHTH-ACELL PERTUSSIS 5-2.5-18.5 LF-MCG/0.5 IM SUSP: 0.5000 mL | Freq: Once | INTRAMUSCULAR | Status: DC

## 2014-08-02 MED ORDER — OXYTOCIN 40 UNITS IN LACTATED RINGERS INFUSION - SIMPLE MED: 62.5000 mL/h | INTRAVENOUS | Status: AC

## 2014-08-02 MED ORDER — ONDANSETRON HCL 4 MG/2ML IJ SOLN
INTRAMUSCULAR | Status: DC | PRN
  Administered 2014-08-02: 4 mg via INTRAVENOUS

## 2014-08-02 SURGICAL SUPPLY — 26 items
CLAMP CORD UMBIL (MISCELLANEOUS) IMPLANT
CLOTH BEACON ORANGE TIMEOUT ST (SAFETY) ×3 IMPLANT
DRAPE SHEET LG 3/4 BI-LAMINATE (DRAPES) IMPLANT
DRSG OPSITE POSTOP 4X10 (GAUZE/BANDAGES/DRESSINGS) ×3 IMPLANT
DURAPREP 26ML APPLICATOR (WOUND CARE) ×3 IMPLANT
ELECT REM PT RETURN 9FT ADLT (ELECTROSURGICAL) ×3
ELECTRODE REM PT RTRN 9FT ADLT (ELECTROSURGICAL) ×1 IMPLANT
EXTRACTOR VACUUM M CUP 4 TUBE (SUCTIONS) IMPLANT
EXTRACTOR VACUUM M CUP 4' TUBE (SUCTIONS)
GLOVE SURG ORTHO 8.0 STRL STRW (GLOVE) ×3 IMPLANT
GOWN STRL REUS W/TWL LRG LVL3 (GOWN DISPOSABLE) ×6 IMPLANT
KIT ABG SYR 3ML LUER SLIP (SYRINGE) ×3 IMPLANT
NEEDLE HYPO 25X5/8 SAFETYGLIDE (NEEDLE) ×3 IMPLANT
NS IRRIG 1000ML POUR BTL (IV SOLUTION) ×3 IMPLANT
PACK C SECTION WH (CUSTOM PROCEDURE TRAY) ×3 IMPLANT
PAD OB MATERNITY 4.3X12.25 (PERSONAL CARE ITEMS) ×3 IMPLANT
SUT MNCRL 0 VIOLET CTX 36 (SUTURE) ×3 IMPLANT
SUT MON AB 4-0 PS1 27 (SUTURE) ×3 IMPLANT
SUT MONOCRYL 0 CTX 36 (SUTURE) ×6
SUT PDS AB 1 CT  36 (SUTURE)
SUT PDS AB 1 CT 36 (SUTURE) IMPLANT
SUT VIC AB 1 CTX 36 (SUTURE)
SUT VIC AB 1 CTX36XBRD ANBCTRL (SUTURE) IMPLANT
TOWEL OR 17X24 6PK STRL BLUE (TOWEL DISPOSABLE) ×3 IMPLANT
TRAY FOLEY CATH 14FR (SET/KITS/TRAYS/PACK) ×3 IMPLANT
WATER STERILE IRR 1000ML POUR (IV SOLUTION) ×3 IMPLANT

## 2014-08-02 NOTE — Transfer of Care (Signed)
Immediate Anesthesia Transfer of Care Note  Patient: Erica PomfretJanine Green  Procedure(s) Performed: Procedure(s) with comments: REPEAT CESAREAN SECTION (N/A) - Repeat edc 08/09/14  Patient Location: PACU  Anesthesia Type:Spinal  Level of Consciousness: awake, alert  and oriented  Airway & Oxygen Therapy: Patient Spontanous Breathing  Post-op Assessment: Report given to PACU RN and Post -op Vital signs reviewed and stable  Post vital signs: Reviewed and stable  Complications: No apparent anesthesia complications

## 2014-08-02 NOTE — Anesthesia Procedure Notes (Signed)
Spinal Patient location during procedure: OR Preanesthetic Checklist Completed: patient identified, site marked, surgical consent, pre-op evaluation, timeout performed, IV checked, risks and benefits discussed and monitors and equipment checked Spinal Block Patient position: sitting Prep: DuraPrep Patient monitoring: heart rate, cardiac monitor, continuous pulse ox and blood pressure Approach: midline Location: L3-4 Injection technique: single-shot Needle Needle type: Sprotte  Needle gauge: 24 G Needle length: 9 cm Assessment Sensory level: T4 Additional Notes Spinal Dosage in OR  Bupivicaine ml       1.5 PFMS04   mcg        100 Fentanyl mcg            25    

## 2014-08-02 NOTE — Anesthesia Postprocedure Evaluation (Signed)
  Anesthesia Post-op Note  Anesthesia Post Note  Patient: Erica PomfretJanine Green  Procedure(s) Performed: Procedure(s) (LRB): REPEAT CESAREAN SECTION (N/A)  Anesthesia type: General  Patient location: PACU  Post pain: Pain level controlled  Post assessment: Post-op Vital signs reviewed  Last Vitals:  Filed Vitals:   08/02/14 1515  BP: 93/55  Pulse: 50  Temp:   Resp: 17    Post vital signs: Reviewed  Level of consciousness: sedated  Complications: No apparent anesthesia complications

## 2014-08-02 NOTE — Anesthesia Preprocedure Evaluation (Signed)
Anesthesia Evaluation  Patient identified by MRN, date of birth, ID band Patient awake    Reviewed: Allergy & Precautions, H&P , NPO status , Patient's Chart, lab work & pertinent test results, reviewed documented beta blocker date and time   History of Anesthesia Complications Negative for: history of anesthetic complications  Airway Mallampati: I  TM Distance: >3 FB Neck ROM: full    Dental  (+) Teeth Intact   Pulmonary neg pulmonary ROS,  breath sounds clear to auscultation        Cardiovascular negative cardio ROS  Rhythm:regular Rate:Normal     Neuro/Psych negative neurological ROS  negative psych ROS   GI/Hepatic Neg liver ROS, IBS   Endo/Other  negative endocrine ROS  Renal/GU negative Renal ROS     Musculoskeletal   Abdominal   Peds  Hematology  (+) anemia ,   Anesthesia Other Findings   Reproductive/Obstetrics (+) Pregnancy (h/o prior C/S --> for repeat C/S)                             Anesthesia Physical Anesthesia Plan  ASA: II  Anesthesia Plan: Spinal   Post-op Pain Management:    Induction:   Airway Management Planned:   Additional Equipment:   Intra-op Plan:   Post-operative Plan:   Informed Consent: I have reviewed the patients History and Physical, chart, labs and discussed the procedure including the risks, benefits and alternatives for the proposed anesthesia with the patient or authorized representative who has indicated his/her understanding and acceptance.     Plan Discussed with: Surgeon and CRNA  Anesthesia Plan Comments:         Anesthesia Quick Evaluation

## 2014-08-02 NOTE — Op Note (Signed)
Cesarean Section Procedure Note  Pre-operative Diagnosis: IUP at 39 weeks ,prev C/S desires repeat  Post-operative Diagnosis: same  Surgeon: Turner DanielsLOWE,Clemma Johnsen C   Assistants: none  Anesthesia:spinal  Procedure:  Low Segment Transverse cesarean section  Procedure Details  The patient was seen in the Holding Room. The risks, benefits, complications, treatment options, and expected outcomes were discussed with the patient.  The patient concurred with the proposed plan, giving informed consent.  The site of surgery properly noted/marked.. A Time Out was held and the above information confirmed.  After induction of anesthesia, the patient was draped and prepped in the usual sterile manner. A Pfannenstiel incision was made and carried down through the subcutaneous tissue to the fascia. Fascial incision was made and extended transversely. The fascia was separated from the underlying rectus tissue superiorly and inferiorly. The peritoneum was identified and entered. Peritoneal incision was extended longitudinally. The utero-vesical peritoneal reflection was incised transversely and the bladder flap was bluntly freed from the lower uterine segment. A low transverse uterine incision was made. Delivered from vertex presentation with one pull VE was a baby with Apgar scores of 9 at one minute and 9 at five minutes. After the umbilical cord was clamped and cut cord blood was obtained for evaluation. The placenta was removed intact and appeared normal. The uterine outline, tubes and ovaries appeared normal. The uterine incision was closed with running locked sutures of 0 monocryl and imbricated with 0 monocryl. Hemostasis was observed. Lavage was carried out until clear. The peritoneum was then closed with 0 monocryl and rectus muscles plicated in the midline.  After hemostasis was assured, the fascia was then reapproximated with running sutures of 0 Vicryl. Irrigation was applied and after adequate hemostasis was  assured, the skin was reapproximated with staples.  Instrument, sponge, and needle counts were correct prior the abdominal closure and at the conclusion of the case. The patient received 2 grams cefotetan preoperatively.  Findings: female  Estimated Blood Loss:  600cc         Specimens: Placenta was sent to L&D         Complications:  None

## 2014-08-02 NOTE — Plan of Care (Signed)
Problem: Phase I Progression Outcomes Goal: Foley catheter patent Outcome: Completed/Met Date Met:  08/02/14 Goal: IS, TCDB as ordered Outcome: Completed/Met Date Met:  08/02/14

## 2014-08-03 LAB — CBC
HCT: 27.6 % — ABNORMAL LOW (ref 36.0–46.0)
Hemoglobin: 9.2 g/dL — ABNORMAL LOW (ref 12.0–15.0)
MCH: 29.4 pg (ref 26.0–34.0)
MCHC: 33.3 g/dL (ref 30.0–36.0)
MCV: 88.2 fL (ref 78.0–100.0)
Platelets: 164 10*3/uL (ref 150–400)
RBC: 3.13 MIL/uL — ABNORMAL LOW (ref 3.87–5.11)
RDW: 13.2 % (ref 11.5–15.5)
WBC: 10.1 10*3/uL (ref 4.0–10.5)

## 2014-08-03 LAB — BIRTH TISSUE RECOVERY COLLECTION (PLACENTA DONATION)

## 2014-08-03 NOTE — Addendum Note (Signed)
Addendum  created 08/03/14 0848 by Lincoln BrighamAngela Draughon Tyia Binford, CRNA   Modules edited: Notes Section   Notes Section:  File: [id number]

## 2014-08-03 NOTE — Lactation Note (Signed)
This note was copied from the chart of Erica Germaine PomfretJanine Recht. Lactation Consultation Note  Patient Name: Erica Germaine PomfretJanine Neville WGNFA'OToday's Date: 08/03/2014 Reason for consult: Initial assessment [facility]. Baby had wide mouth with lips well flanged. Baby came off the breast and then Mom tried to re-latch in cradle hold and baby was having trouble obtaining depth. LC assisted Mom with cross cradle and obtaining good depth with initial latch. Baby demonstrated a good rhythmic suck at the breast. Mom BF her 1st baby for 20 months. Reminded Mom of the difference BF a newborn. Basic teaching reviewed. Cluster feeding discussed. Advised baby should be at the breast 8-12 times in 24 hours. Lactation brochure left for review, advised of OP services and support group. Encouraged to call for assist as needed.   Maternal Data Has patient been taught Hand Expression?: Yes Does the patient have breastfeeding experience prior to this delivery?: Yes  Feeding Feeding Type: Breast Fed Length of feed: 10 min  LATCH Score/Interventions Latch: Grasps breast easily, tongue down, lips flanged, rhythmical sucking.  Audible Swallowing: A few with stimulation  Type of Nipple: Everted at rest and after stimulation  Comfort (Breast/Nipple): Soft / non-tender     Hold (Positioning): Assistance needed to correctly position infant at breast and maintain latch. Intervention(s): Breastfeeding basics reviewed;Support Pillows;Position options  LATCH Score: 8  Lactation Tools Discussed/Used WIC Program: No   Consult Status Consult Status: Follow-up Date: 08/04/14 Follow-up type: In-patient    Alfred LevinsGranger, Demontez Novack Ann 08/03/2014, 4:05 PM

## 2014-08-03 NOTE — Progress Notes (Signed)
Subjective: Postpartum Day 1: Cesarean Delivery Patient reports tolerating PO.    Objective: Vital signs in last 24 hours: Temp:  [97.7 F (36.5 C)-98.3 F (36.8 C)] 97.7 F (36.5 C) (12/11 0705) Pulse Rate:  [50-69] 50 (12/11 0705) Resp:  [10-21] 16 (12/11 0705) BP: (76-115)/(47-76) 107/47 mmHg (12/11 0705) SpO2:  [95 %-100 %] 97 % (12/11 0316)  Physical Exam:  General: alert and cooperative Lochia: appropriate Uterine Fundus: firm Incision: healing well DVT Evaluation: No evidence of DVT seen on physical exam. Negative Homan's sign. No cords or calf tenderness. No significant calf/ankle edema.   Recent Labs  08/02/14 1220 08/03/14 0530  HGB 9.9* 9.2*  HCT 29.3* 27.6*    Assessment/Plan: Status post Cesarean section. Doing well postoperatively.  Continue current care.  Edit Ricciardelli G 08/03/2014, 8:54 AM

## 2014-08-03 NOTE — Anesthesia Postprocedure Evaluation (Signed)
Anesthesia Post Note  Patient: Erica PomfretJanine Luviano  Procedure(s) Performed: Procedure(s) (LRB): REPEAT CESAREAN SECTION (N/A)  Anesthesia type:Spinal  Patient location: Mother/Baby  Post pain: Pain level controlled  Post assessment: Post-op Vital signs reviewed  Last Vitals:  Filed Vitals:   08/03/14 0705  BP: 107/47  Pulse: 50  Temp: 36.5 C  Resp: 16    Post vital signs: Reviewed  Level of consciousness:alert  Complications: No apparent anesthesia complications

## 2014-08-03 NOTE — Plan of Care (Signed)
Problem: Consults Goal: Postpartum Patient Education (See Patient Education module for education specifics.)  Outcome: Completed/Met Date Met:  08/03/14  Problem: Phase I Progression Outcomes Goal: Pain controlled with appropriate interventions Outcome: Completed/Met Date Met:  08/03/14 Goal: OOB as tolerated unless otherwise ordered Outcome: Completed/Met Date Met:  08/03/14

## 2014-08-04 ENCOUNTER — Encounter (HOSPITAL_COMMUNITY): Payer: Self-pay | Admitting: Obstetrics and Gynecology

## 2014-08-04 NOTE — Lactation Note (Signed)
This note was copied from the chart of Erica Green. Lactation Consultation Note  Patient Name: Erica Germaine PomfretJanine Fairbank ZOXWR'UToday's Date: 08/04/2014 Reason for consult: Follow-up assessment  Mom will be D/C tomorrow . Per mom the baby has been sleepy at the breast this am. LC assessed breast tissue with moms permission , swollen areolas noted. LC assisted with latch in football position, working on depth several swallows noted. Increased with breast compressions , Baby released on his own after 10 mins. LC noted a short labial frenulum , lips do stretch and flange , and a short frenulum. LC discussed with mom and dad. LC recommending shells and hand pump. Pre pump prior ro every feeding after breast massage, hand express, To make the nipple and areola more elastic for a deeper latch. MBU RN Delice BisonBettie Rutherford aware.    Maternal Data Has patient been taught Hand Expression?: Yes  Feeding Feeding Type: Breast Fed Length of feed: 10 min  LATCH Score/Interventions Latch: Grasps breast easily, tongue down, lips flanged, rhythmical sucking.  Audible Swallowing: Spontaneous and intermittent  Type of Nipple: Everted at rest and after stimulation (swollen areolas )  Comfort (Breast/Nipple): Soft / non-tender     Hold (Positioning): Assistance needed to correctly position infant at breast and maintain latch. Intervention(s): Breastfeeding basics reviewed;Support Pillows;Position options;Skin to skin  LATCH Score: 9  Lactation Tools Discussed/Used Tools:  (needs shells , and hand pump , MBU RN Loew[employer] CorporationBettie Rutherford )   Consult Status Consult Status: Follow-up Date: 08/05/14 Follow-up type: In-patient    Kathrin Greathouseorio, Eliasar Hlavaty Ann 08/04/2014, 10:59 AM

## 2014-08-04 NOTE — Progress Notes (Signed)
Subjective: Postpartum Day 2: Cesarean Delivery Patient reports incisional pain and tolerating PO.    Objective: Vital signs in last 24 hours: Temp:  [97.8 F (36.6 C)-98 F (36.7 C)] 98 F (36.7 C) (12/12 0700) Pulse Rate:  [56-58] 58 (12/12 0700) Resp:  [18-20] 20 (12/12 0700) BP: (102-105)/(56-60) 105/60 mmHg (12/12 0700)  Physical Exam:  General: alert, cooperative and appears stated age 67Lochia: appropriate Uterine Fundus: firm Incision: healing well, no significant drainage, no dehiscence DVT Evaluation: No evidence of DVT seen on physical exam.   Recent Labs  08/02/14 1220 08/03/14 0530  HGB 9.9* 9.2*  HCT 29.3* 27.6*    Assessment/Plan: Status post Cesarean section. Doing well postoperatively.  Continue current care.  Keliyah Spillman L 08/04/2014, 8:37 AM

## 2014-08-05 MED ORDER — OXYCODONE-ACETAMINOPHEN 5-325 MG PO TABS: 1.0000 | ORAL_TABLET | ORAL | Status: AC | PRN

## 2014-08-05 MED ORDER — IBUPROFEN 600 MG PO TABS: 600.0000 mg | ORAL_TABLET | Freq: Four times a day (QID) | ORAL | Status: AC

## 2014-08-05 NOTE — Lactation Note (Signed)
This note was copied from the chart of Erica Green. Lactation Consultation Note  Patient Name: Erica Green VWUJW'JToday's Date: 08/05/2014 Reason for consult: Follow-up assessment  Baby is 9970 hours old, and per mom has been feeding often this am. LC observed the last part of a feeding on the right breast without a nipple shield  And felt the baby had a sluggish pattern with a few swallows. Per mom had given the baby 5 ml with a syringe  Finger feeding before the feeding. LC highly recommended to mom latching with a cross cradle and going to the  The cradle so depth could be achieved easier. Also taking in to consideration the short labial and anterior  Frenulum noticed yesterday. Baby intermittently noted to be in a swallowing pattern , increased with breast compressions, But also noted hanging  out non - nutritive behavior. LC recommended using a Nipple shield to give the baby's palate more stimulation . And adding EBM in the NS for an appetizer to give extra calories for weight loss. Baby re-latched with a #24 Nipple shield and the feeding pattern was more effective and nutritive. Baby fed for 10 mins with multiply swallows.  And released on his own. Milk noted in the NS . Mom latched the baby on the right breast without the nipple shield and the baby was intermittently nutritive. LC reviewed sore nipple and engorgement prevention and tx. Mom already has comfort gels ., breast shells, , and a DEBP set up , and #20 and #24 NS. Mom and dad receptive [facility] . ALso mom plans to do extra pumping.    Maternal Data    Feeding Feeding Type: Breast Fed (right breast without the nipple shield ) Length of feed: 10 min  LATCH Score/Interventions Latch: Grasps breast easily, tongue down, lips flanged, rhythmical sucking.  Audible Swallowing: Spontaneous and intermittent  Type of Nipple: Everted at rest and after stimulation  Comfort (Breast/Nipple): Soft /  non-tender     Hold (Positioning): Assistance needed to correctly position infant at breast and maintain latch. Intervention(s): Breastfeeding basics reviewed;Support Pillows;Position options;Skin to skin  LATCH Score: 9  Lactation Tools Discussed/Used Tools: Nipple Dorris CarnesShields;Shells;Pump;Comfort gels Nipple shield size: 24 Shell Type: Inverted Breast pump type: Double-Electric Breast Pump WIC Program: No Pump Review: Setup, frequency, and cleaning (reviewed )   Consult Status Consult Status: Complete (see LC note ) Date: 08/05/14 Follow-up type: In-patient    Erica Green, Erica Green 08/05/2014, 11:56 AM

## 2014-08-05 NOTE — Discharge Summary (Signed)
Obstetric Discharge Summary Reason for Admission: cesarean section Prenatal Procedures: none Intrapartum Procedures: cesarean: low cervical, transverse Postpartum Procedures: none Complications-Operative and Postpartum: none HEMOGLOBIN  Date Value Ref Range Status  08/03/2014 9.2* 12.0 - 15.0 g/dL Final   HCT  Date Value Ref Range Status  08/03/2014 27.6* 36.0 - 46.0 % Final    Physical Exam:  General: alert, cooperative and appears stated age 83Lochia: appropriate Uterine Fundus: firm Incision: healing well, no significant drainage, no dehiscence, no significant erythema DVT Evaluation: No evidence of DVT seen on physical exam.  Discharge Diagnoses: Term Pregnancy-delivered  Discharge Information: Date: 08/05/2014 Activity: pelvic rest Diet: routine Medications: Ibuprofen and Percocet Condition: stable Instructions: refer to practice specific booklet Discharge to: home   Newborn Data: Live born female  Birth Weight: 8 lb 1.8 oz (3680 g) APGAR: 9, 9  Home with mother.  Erica Green L 08/05/2014, 8:18 AM

## 2015-07-01 ENCOUNTER — Other Ambulatory Visit (HOSPITAL_COMMUNITY): Payer: BC Managed Care – PPO

## 2015-12-09 ENCOUNTER — Other Ambulatory Visit: Payer: Self-pay | Admitting: Obstetrics and Gynecology

## 2015-12-09 DIAGNOSIS — N63 Unspecified lump in unspecified breast: Secondary | ICD-10-CM

## 2015-12-11 ENCOUNTER — Other Ambulatory Visit: Payer: Self-pay

## 2015-12-23 ENCOUNTER — Ambulatory Visit
Admission: RE | Admit: 2015-12-23 | Discharge: 2015-12-23 | Disposition: A | Discharge status: Home / Self Care | Payer: BLUE CROSS/BLUE SHIELD | Source: Ambulatory Visit | Attending: Obstetrics and Gynecology | Admitting: Obstetrics and Gynecology

## 2015-12-23 DIAGNOSIS — N63 Unspecified lump in unspecified breast: Secondary | ICD-10-CM

## 2018-02-08 ENCOUNTER — Other Ambulatory Visit: Payer: Self-pay | Admitting: Obstetrics and Gynecology

## 2018-02-08 DIAGNOSIS — R928 Other abnormal and inconclusive findings on diagnostic imaging of breast: Secondary | ICD-10-CM

## 2018-02-10 ENCOUNTER — Ambulatory Visit
Admission: RE | Admit: 2018-02-10 | Discharge: 2018-02-10 | Disposition: A | Discharge status: Home / Self Care | Payer: BLUE CROSS/BLUE SHIELD | Source: Ambulatory Visit | Attending: Obstetrics and Gynecology | Admitting: Obstetrics and Gynecology

## 2018-02-10 ENCOUNTER — Ambulatory Visit: Payer: BLUE CROSS/BLUE SHIELD

## 2018-02-10 DIAGNOSIS — R928 Other abnormal and inconclusive findings on diagnostic imaging of breast: Secondary | ICD-10-CM

## 2018-04-19 ENCOUNTER — Other Ambulatory Visit: Payer: Self-pay | Admitting: Obstetrics and Gynecology

## 2018-04-20 ENCOUNTER — Other Ambulatory Visit: Payer: Self-pay | Admitting: Obstetrics and Gynecology

## 2018-04-20 DIAGNOSIS — Z803 Family history of malignant neoplasm of breast: Secondary | ICD-10-CM

## 2018-08-07 ENCOUNTER — Other Ambulatory Visit: Payer: BLUE CROSS/BLUE SHIELD

## 2018-08-09 ENCOUNTER — Ambulatory Visit
Admission: RE | Admit: 2018-08-09 | Discharge: 2018-08-09 | Disposition: A | Discharge status: Home / Self Care | Payer: BLUE CROSS/BLUE SHIELD | Source: Ambulatory Visit | Attending: Obstetrics and Gynecology | Admitting: Obstetrics and Gynecology

## 2018-08-09 DIAGNOSIS — Z803 Family history of malignant neoplasm of breast: Secondary | ICD-10-CM

## 2018-08-09 MED ORDER — GADOBUTROL 1 MMOL/ML IV SOLN
7.0000 mL | Freq: Once | INTRAVENOUS | Status: AC | PRN
  Administered 2018-08-09: 7 mL via INTRAVENOUS

## 2018-08-10 ENCOUNTER — Other Ambulatory Visit: Payer: Self-pay | Admitting: Obstetrics and Gynecology

## 2018-08-10 DIAGNOSIS — R9389 Abnormal findings on diagnostic imaging of other specified body structures: Secondary | ICD-10-CM

## 2018-08-12 ENCOUNTER — Encounter: Payer: Self-pay | Admitting: Radiology

## 2018-08-12 ENCOUNTER — Ambulatory Visit
Admission: RE | Admit: 2018-08-12 | Discharge: 2018-08-12 | Disposition: A | Discharge status: Home / Self Care | Payer: BLUE CROSS/BLUE SHIELD | Source: Ambulatory Visit | Attending: Obstetrics and Gynecology | Admitting: Obstetrics and Gynecology

## 2018-08-12 DIAGNOSIS — R9389 Abnormal findings on diagnostic imaging of other specified body structures: Secondary | ICD-10-CM

## 2018-08-12 MED ORDER — GADOBUTROL 1 MMOL/ML IV SOLN
7.0000 mL | Freq: Once | INTRAVENOUS | Status: AC | PRN
  Administered 2018-08-12: 7 mL via INTRAVENOUS

## 2018-08-16 ENCOUNTER — Other Ambulatory Visit: Payer: BLUE CROSS/BLUE SHIELD

## 2019-01-13 ENCOUNTER — Other Ambulatory Visit: Payer: Self-pay | Admitting: Obstetrics and Gynecology

## 2019-01-13 DIAGNOSIS — R928 Other abnormal and inconclusive findings on diagnostic imaging of breast: Secondary | ICD-10-CM

## 2019-01-14 ENCOUNTER — Other Ambulatory Visit: Payer: Self-pay

## 2019-01-14 ENCOUNTER — Ambulatory Visit
Admission: RE | Admit: 2019-01-14 | Discharge: 2019-01-14 | Disposition: A | Discharge status: Home / Self Care | Payer: BLUE CROSS/BLUE SHIELD | Source: Ambulatory Visit | Attending: Obstetrics and Gynecology | Admitting: Obstetrics and Gynecology

## 2019-01-14 DIAGNOSIS — R928 Other abnormal and inconclusive findings on diagnostic imaging of breast: Secondary | ICD-10-CM

## 2019-01-14 MED ORDER — GADOBUTROL 1 MMOL/ML IV SOLN
7.0000 mL | Freq: Once | INTRAVENOUS | Status: AC | PRN
  Administered 2019-01-14: 7 mL via INTRAVENOUS

## 2020-12-16 ENCOUNTER — Other Ambulatory Visit: Payer: Self-pay | Admitting: Obstetrics and Gynecology

## 2020-12-16 DIAGNOSIS — Z803 Family history of malignant neoplasm of breast: Secondary | ICD-10-CM

## 2021-01-25 ENCOUNTER — Other Ambulatory Visit: Payer: Self-pay

## 2021-01-25 ENCOUNTER — Ambulatory Visit
Admission: RE | Admit: 2021-01-25 | Discharge: 2021-01-25 | Disposition: A | Discharge status: Home / Self Care | Payer: BLUE CROSS/BLUE SHIELD | Source: Ambulatory Visit | Attending: Obstetrics and Gynecology | Admitting: Obstetrics and Gynecology

## 2021-01-25 DIAGNOSIS — Z803 Family history of malignant neoplasm of breast: Secondary | ICD-10-CM

## 2021-01-25 MED ORDER — GADOBENATE DIMEGLUMINE 529 MG/ML IV SOLN
7.0000 mL | Freq: Once | INTRAVENOUS | Status: AC | PRN
  Administered 2021-01-25: 7 mL via INTRAVENOUS

## 2021-08-29 IMAGING — MR MR BREAST BILAT WO/W CM
8 of 12 series · 33 of 48 positions shown · IV contrast (7 ml gadavist)
Comparison: Previous exam(s).

CLINICAL DATA: 45-year-old female with a strong family history of
breast cancer. Patient has a history of 2 biopsies in the right
breast which showed fibrocystic change and PASH in the upper-inner
quadrant of the right breast and adenosis in the lower outer
quadrant of the right breast.

LABS:  None obtained at the time of imaging
EXAM:
BILATERAL BREAST MRI WITH AND WITHOUT CONTRAST
TECHNIQUE: Multiplanar, multisequence MR images of both breasts were obtained
prior to and following the intravenous administration of 7 ml of
Gadavist

[Series 2: t2_tirm_tra ipat (a-p) · axial · 3.0mm · 0.74mm/px · 1 of 55 slices shown]
[im 1/55]
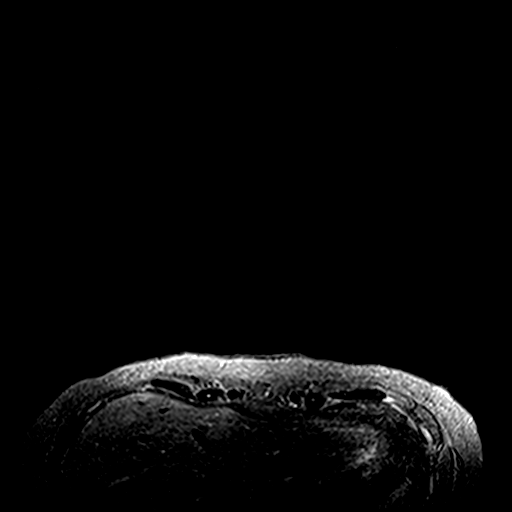

[Series 3: fl3d pre-cm no · axial · non-contrast · 1.2mm · 0.99mm/px · z∈[-89,+83]mm · 5 of 144 slices shown]
[im 1/144]
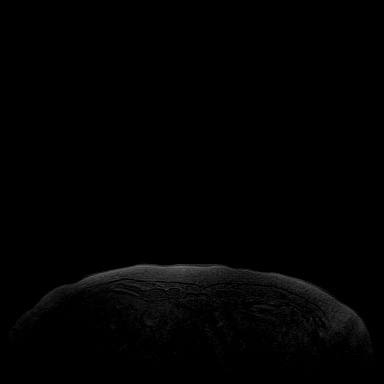
[im 36/144]
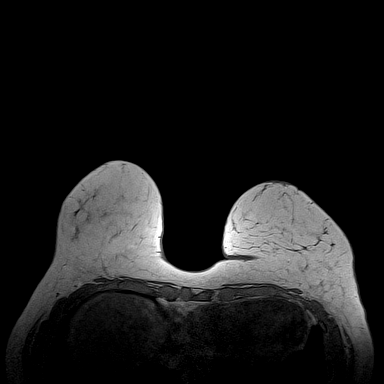
[im 72/144]
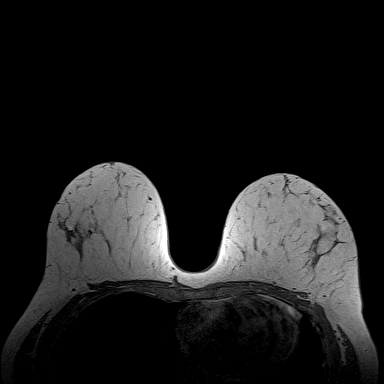
[im 108/144]
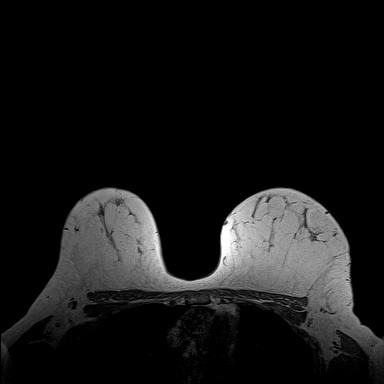
[im 144/144]
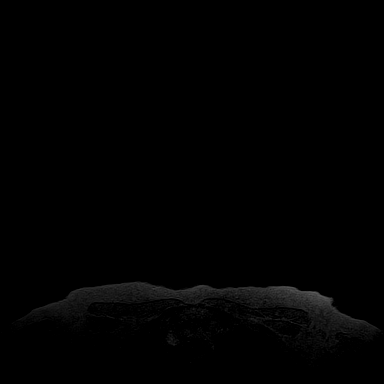

[Series 4: fl3d pre-cm · axial · non-contrast · 1.2mm · 0.99mm/px · z∈[-89,+83]mm · 5 of 144 slices shown]
[im 1/144]
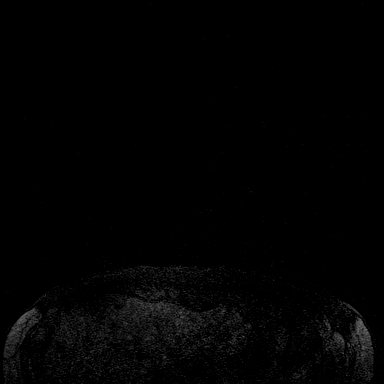
[im 36/144]
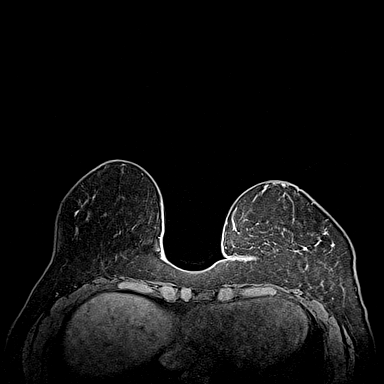
[im 72/144]
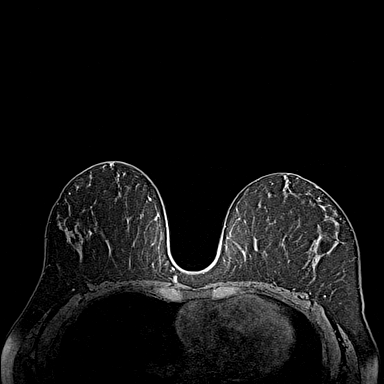
[im 108/144]
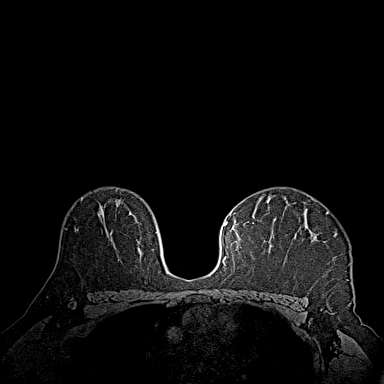
[im 144/144]
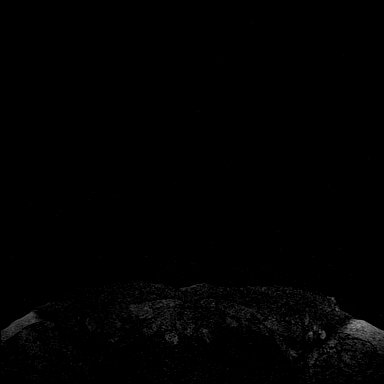

[Series 5: fl3d post-cm 20 · axial · 1.2mm · 0.99mm/px · z∈[-89,+83]mm · 5 of 144 slices shown (1 of 3)]
[im 1/144]
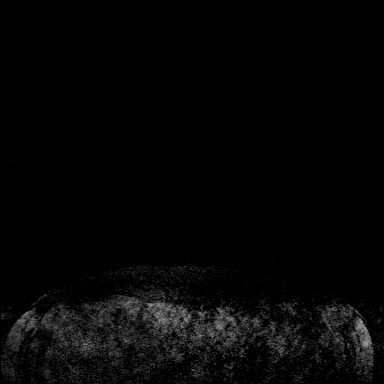
[im 36/144]
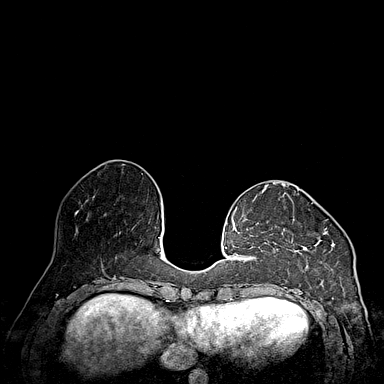
[im 72/144]
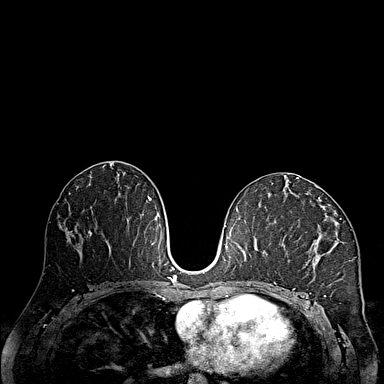
[im 108/144]
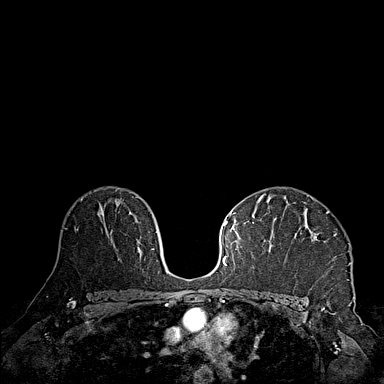
[im 144/144]
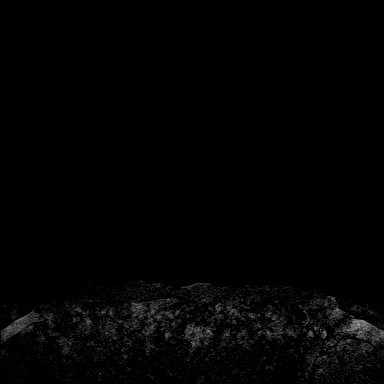

[Series 6: fl3d post-cm 20 · axial · 1.2mm · 0.99mm/px · z∈[-89,+83]mm · 5 of 144 slices shown (2 of 3)]
[im 1/144]
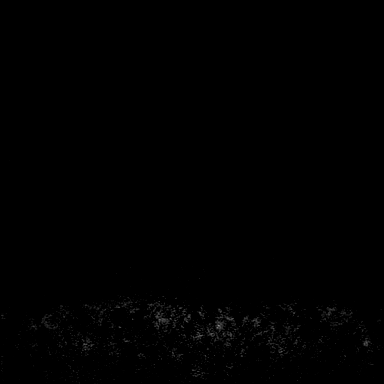
[im 36/144]
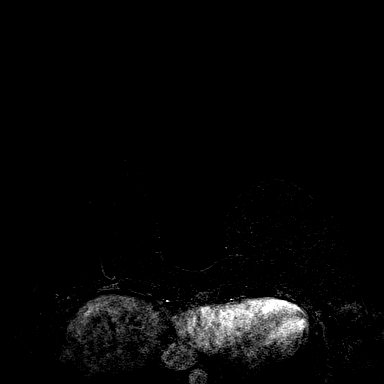
[im 72/144]
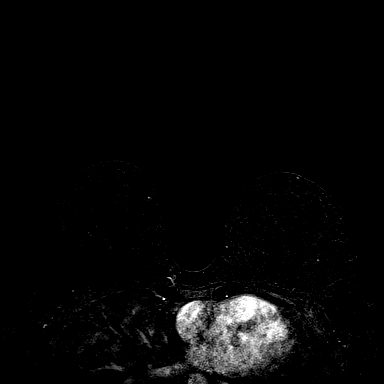
[im 108/144]
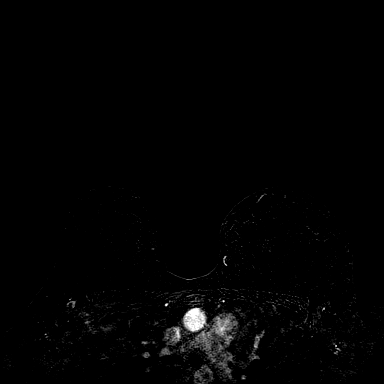
[im 144/144]
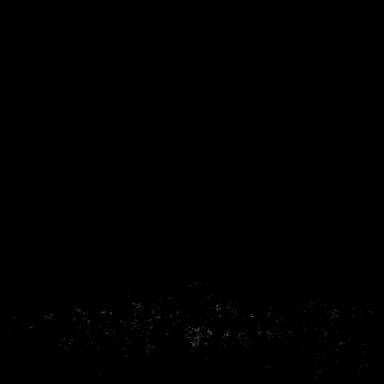

[Series 7: fl3d post-cm 20 · axial · 172.8mm · 0.99mm/px · 1 of 1 slices shown (3 of 3)]
[im 1/1]
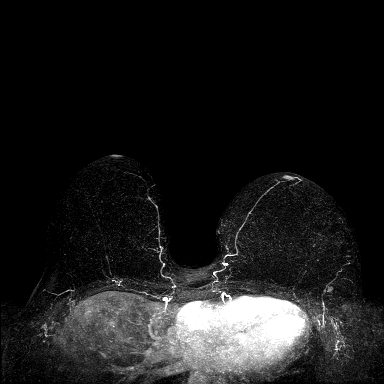

[Series 8: fl3d post-cm 3min · axial · 1.2mm · 0.99mm/px · z∈[-89,+83]mm · 6 of 144 slices shown]
[im 1/144]
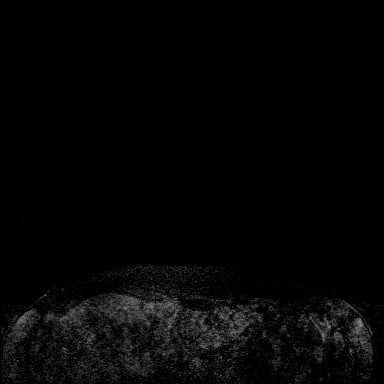
[im 29/144]
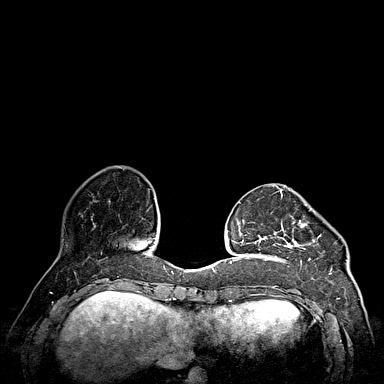
[im 58/144]
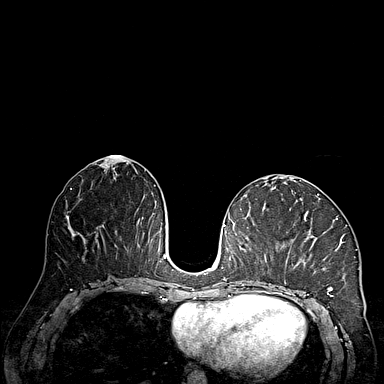
[im 86/144]
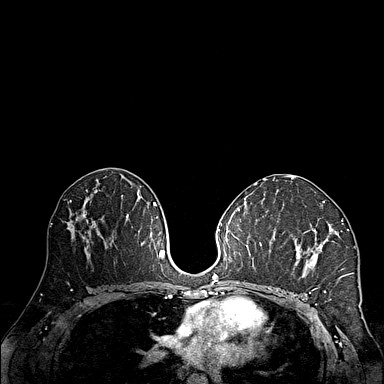
[im 115/144]
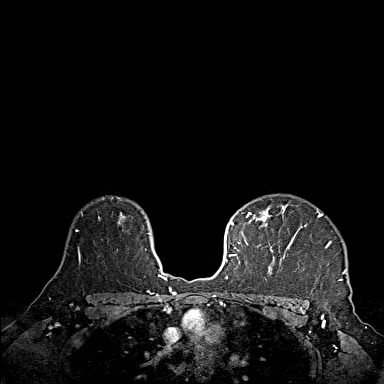
[im 144/144]
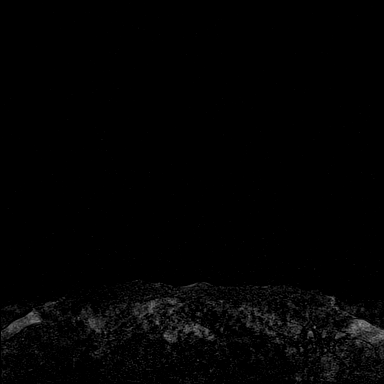

[Series 9: fl3d post-cm 3min_sub · axial · 1.2mm · 0.99mm/px · z∈[-89,+48]mm · 5 of 144 slices shown]
[im 1/144]
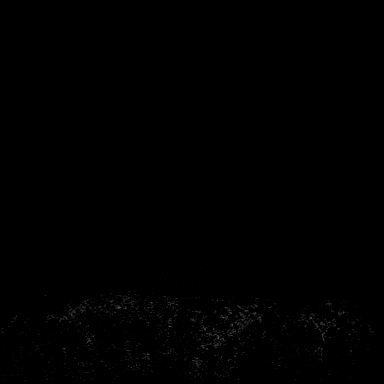
[im 29/144]
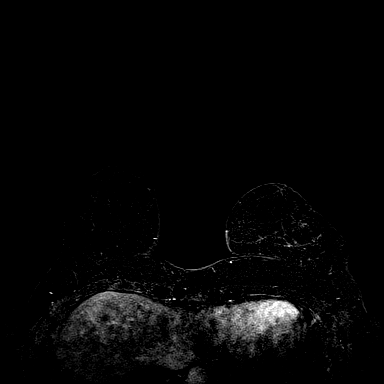
[im 58/144]
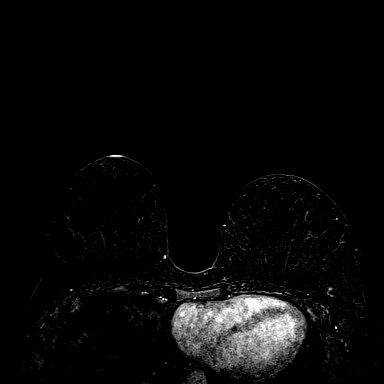
[im 86/144]
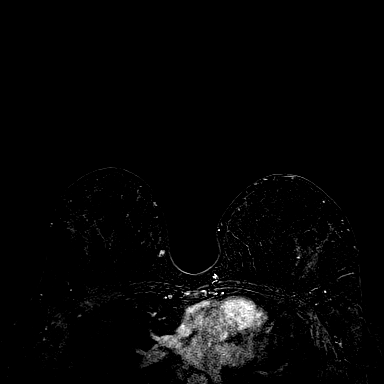
[im 115/144]
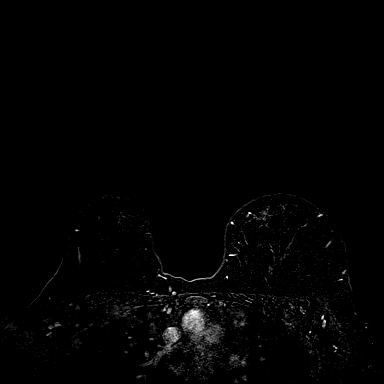

[33 of 48 positions shown; findings below may reference images not displayed]

Three-dimensional MR images were rendered by post-processing of the
original MR data on an independent workstation. The
three-dimensional MR images were interpreted, and findings are
reported in the following complete MRI report for this study. Three
dimensional images were evaluated at the independent interpreting
workstation using the DynaCAD thin client.
FINDINGS: Breast composition: b. Scattered fibroglandular tissue.

Background parenchymal enhancement: Moderate.

Right breast: No mass or abnormal enhancement.

Left breast: No mass or abnormal enhancement.

Lymph nodes: No abnormal appearing lymph nodes.

Ancillary findings:  None.
IMPRESSION: No abnormal enhancement in either breast.

RECOMMENDATION:
Bilateral screening mammogram in Monday May, 2021 is recommended.

The [employer] Society recommends annual MRI and mammography in
patients with an estimated lifetime risk of developing breast cancer
greater than 20 - 25%, or who are known or suspected to be positive
for the breast cancer gene.

BI-RADS CATEGORY  1: Negative.
